# Patient Record
Sex: Female | Born: 1960
Health system: Southern US, Community
[De-identification: ages and names within clinical notes are randomized; demographics above are authoritative.]

## PROBLEM LIST (undated history)

## (undated) DIAGNOSIS — K279 Peptic ulcer, site unspecified, unspecified as acute or chronic, without hemorrhage or perforation: Secondary | ICD-10-CM

## (undated) DIAGNOSIS — C4492 Squamous cell carcinoma of skin, unspecified: Secondary | ICD-10-CM

## (undated) DIAGNOSIS — K900 Celiac disease: Secondary | ICD-10-CM

## (undated) DIAGNOSIS — D229 Melanocytic nevi, unspecified: Secondary | ICD-10-CM

## (undated) DIAGNOSIS — M8580 Other specified disorders of bone density and structure, unspecified site: Secondary | ICD-10-CM

## (undated) DIAGNOSIS — E785 Hyperlipidemia, unspecified: Secondary | ICD-10-CM

## (undated) DIAGNOSIS — C4491 Basal cell carcinoma of skin, unspecified: Secondary | ICD-10-CM

## (undated) DIAGNOSIS — Z86010 Personal history of colonic polyps: Secondary | ICD-10-CM

## (undated) DIAGNOSIS — Z800 Family history of malignant neoplasm of digestive organs: Secondary | ICD-10-CM

## (undated) HISTORY — DX: Personal history of colonic polyps: Z86.010

## (undated) HISTORY — PX: INNER EAR SURGERY: SHX679

## (undated) HISTORY — PX: TUBAL LIGATION: SHX77

## (undated) HISTORY — DX: Family history of malignant neoplasm of digestive organs: Z80.0

## (undated) HISTORY — DX: Celiac disease: K90.0

## (undated) HISTORY — DX: Melanocytic nevi, unspecified: D22.9

## (undated) HISTORY — DX: Basal cell carcinoma of skin, unspecified: C44.91

## (undated) HISTORY — DX: Squamous cell carcinoma of skin, unspecified: C44.92

## (undated) HISTORY — DX: Hyperlipidemia, unspecified: E78.5

## (undated) HISTORY — PX: ELBOW FRACTURE SURGERY: SHX616

## (undated) HISTORY — DX: Peptic ulcer, site unspecified, unspecified as acute or chronic, without hemorrhage or perforation: K27.9

## (undated) HISTORY — DX: Other specified disorders of bone density and structure, unspecified site: M85.80

---

## 2001-08-18 ENCOUNTER — Other Ambulatory Visit: Admission: RE | Admit: 2001-08-18 | Discharge: 2001-08-18 | Payer: Self-pay | Admitting: Family Medicine

## 2001-08-26 ENCOUNTER — Ambulatory Visit (HOSPITAL_COMMUNITY): Admission: RE | Admit: 2001-08-26 | Discharge: 2001-08-26 | Payer: Self-pay | Admitting: Family Medicine

## 2001-08-26 ENCOUNTER — Encounter: Payer: Self-pay | Admitting: Family Medicine

## 2001-11-18 ENCOUNTER — Ambulatory Visit (HOSPITAL_BASED_OUTPATIENT_CLINIC_OR_DEPARTMENT_OTHER): Admission: RE | Admit: 2001-11-18 | Discharge: 2001-11-18 | Payer: Self-pay | Admitting: Otolaryngology

## 2002-10-10 ENCOUNTER — Other Ambulatory Visit: Admission: RE | Admit: 2002-10-10 | Discharge: 2002-10-10 | Payer: Self-pay | Admitting: Family Medicine

## 2003-10-23 ENCOUNTER — Other Ambulatory Visit: Admission: RE | Admit: 2003-10-23 | Discharge: 2003-10-23 | Payer: Self-pay | Admitting: Family Medicine

## 2003-10-25 DIAGNOSIS — D229 Melanocytic nevi, unspecified: Secondary | ICD-10-CM

## 2003-10-25 HISTORY — DX: Melanocytic nevi, unspecified: D22.9

## 2003-11-13 ENCOUNTER — Ambulatory Visit (HOSPITAL_COMMUNITY): Admission: RE | Admit: 2003-11-13 | Discharge: 2003-11-13 | Payer: Self-pay | Admitting: Family Medicine

## 2004-02-06 ENCOUNTER — Encounter (INDEPENDENT_AMBULATORY_CARE_PROVIDER_SITE_OTHER): Payer: Self-pay | Admitting: Specialist

## 2004-02-06 ENCOUNTER — Encounter: Payer: Self-pay | Admitting: Gastroenterology

## 2004-02-06 ENCOUNTER — Ambulatory Visit (HOSPITAL_COMMUNITY): Admission: RE | Admit: 2004-02-06 | Discharge: 2004-02-06 | Payer: Self-pay | Admitting: Gastroenterology

## 2004-03-27 ENCOUNTER — Encounter: Admission: RE | Admit: 2004-03-27 | Discharge: 2004-03-27 | Payer: Self-pay | Admitting: Family Medicine

## 2004-04-29 ENCOUNTER — Ambulatory Visit: Payer: Self-pay | Admitting: Gastroenterology

## 2005-02-26 ENCOUNTER — Other Ambulatory Visit: Admission: RE | Admit: 2005-02-26 | Discharge: 2005-02-26 | Payer: Self-pay | Admitting: Family Medicine

## 2005-03-26 ENCOUNTER — Ambulatory Visit: Payer: Self-pay | Admitting: Gastroenterology

## 2005-04-09 ENCOUNTER — Ambulatory Visit: Payer: Self-pay | Admitting: Gastroenterology

## 2005-04-10 ENCOUNTER — Encounter: Payer: Self-pay | Admitting: Gastroenterology

## 2005-05-26 ENCOUNTER — Ambulatory Visit: Payer: Self-pay | Admitting: Gastroenterology

## 2006-05-20 ENCOUNTER — Other Ambulatory Visit: Admission: RE | Admit: 2006-05-20 | Discharge: 2006-05-20 | Payer: Self-pay | Admitting: Family Medicine

## 2006-06-21 ENCOUNTER — Encounter: Admission: RE | Admit: 2006-06-21 | Discharge: 2006-06-21 | Payer: Self-pay | Admitting: Family Medicine

## 2006-09-23 ENCOUNTER — Ambulatory Visit: Payer: Self-pay | Admitting: Gastroenterology

## 2006-09-23 LAB — CONVERTED CEMR LAB
ALT: 32 units/L (ref 0–40)
AST: 31 units/L (ref 0–37)
Albumin: 3.4 g/dL — ABNORMAL LOW (ref 3.5–5.2)
Alkaline Phosphatase: 104 units/L (ref 39–117)
BUN: 10 mg/dL (ref 6–23)
Basophils Absolute: 0.1 10*3/uL (ref 0.0–0.1)
Basophils Relative: 1.3 % — ABNORMAL HIGH (ref 0.0–1.0)
Bilirubin, Direct: 0.1 mg/dL (ref 0.0–0.3)
CO2: 30 meq/L (ref 19–32)
Calcium: 8.7 mg/dL (ref 8.4–10.5)
Chloride: 105 meq/L (ref 96–112)
Creatinine, Ser: 0.7 mg/dL (ref 0.4–1.2)
Eosinophils Absolute: 0.3 10*3/uL (ref 0.0–0.6)
Eosinophils Relative: 3.4 % (ref 0.0–5.0)
Ferritin: 4.8 ng/mL — ABNORMAL LOW (ref 10.0–291.0)
GFR calc Af Amer: 116 mL/min
GFR calc non Af Amer: 96 mL/min
Glucose, Bld: 123 mg/dL — ABNORMAL HIGH (ref 70–99)
HCT: 39.5 % (ref 36.0–46.0)
Hemoglobin: 13.5 g/dL (ref 12.0–15.0)
Iron: 40 ug/dL — ABNORMAL LOW (ref 42–145)
Lymphocytes Relative: 32.1 % (ref 12.0–46.0)
MCHC: 34.2 g/dL (ref 30.0–36.0)
MCV: 83.8 fL (ref 78.0–100.0)
Magnesium: 2 mg/dL (ref 1.5–2.5)
Monocytes Absolute: 0.6 10*3/uL (ref 0.2–0.7)
Monocytes Relative: 6.9 % (ref 3.0–11.0)
Neutro Abs: 4.4 10*3/uL (ref 1.4–7.7)
Neutrophils Relative %: 56.3 % (ref 43.0–77.0)
Platelets: 358 10*3/uL (ref 150–400)
Potassium: 3.7 meq/L (ref 3.5–5.1)
RBC: 4.71 M/uL (ref 3.87–5.11)
RDW: 13.3 % (ref 11.5–14.6)
Saturation Ratios: 9.4 % — ABNORMAL LOW (ref 20.0–50.0)
Sodium: 140 meq/L (ref 135–145)
TSH: 2.26 microintl units/mL (ref 0.35–5.50)
Tissue Transglutaminase Ab, IgA: 29 units — ABNORMAL HIGH (ref <5)
Total Bilirubin: 0.6 mg/dL (ref 0.3–1.2)
Total Protein: 6.9 g/dL (ref 6.0–8.3)
Transferrin: 304 mg/dL (ref >=212.0)
WBC: 8 10*3/uL (ref 4.5–10.5)

## 2007-09-14 ENCOUNTER — Encounter: Admission: RE | Admit: 2007-09-14 | Discharge: 2007-09-14 | Payer: Self-pay | Admitting: Family Medicine

## 2008-07-23 DIAGNOSIS — K900 Celiac disease: Secondary | ICD-10-CM | POA: Insufficient documentation

## 2008-07-23 DIAGNOSIS — Z860100 Personal history of colon polyps, unspecified: Secondary | ICD-10-CM | POA: Insufficient documentation

## 2008-07-23 DIAGNOSIS — K219 Gastro-esophageal reflux disease without esophagitis: Secondary | ICD-10-CM | POA: Insufficient documentation

## 2008-07-23 DIAGNOSIS — D509 Iron deficiency anemia, unspecified: Secondary | ICD-10-CM | POA: Insufficient documentation

## 2008-07-23 DIAGNOSIS — K644 Residual hemorrhoidal skin tags: Secondary | ICD-10-CM | POA: Insufficient documentation

## 2008-07-24 ENCOUNTER — Ambulatory Visit: Payer: Self-pay | Admitting: Gastroenterology

## 2008-07-24 DIAGNOSIS — M949 Disorder of cartilage, unspecified: Secondary | ICD-10-CM

## 2008-07-24 DIAGNOSIS — M899 Disorder of bone, unspecified: Secondary | ICD-10-CM | POA: Insufficient documentation

## 2008-07-24 LAB — CONVERTED CEMR LAB
Alkaline Phosphatase: 89 units/L (ref 39–117)
Basophils Absolute: 0.2 10*3/uL — ABNORMAL HIGH (ref 0.0–0.1)
Bilirubin, Direct: 0.1 mg/dL (ref 0.0–0.3)
CO2: 29 meq/L (ref 19–32)
Calcium: 9.4 mg/dL (ref 8.4–10.5)
Ferritin: 36 ng/mL (ref 10.0–291.0)
Folate: 18.4 ng/mL
GFR calc Af Amer: 115 mL/min
Glucose, Bld: 100 mg/dL — ABNORMAL HIGH (ref 70–99)
Hemoglobin: 16.8 g/dL — ABNORMAL HIGH (ref 12.0–15.0)
Lymphocytes Relative: 32.6 % (ref 12.0–46.0)
MCHC: 34.9 g/dL (ref 30.0–36.0)
Monocytes Relative: 14.9 % — ABNORMAL HIGH (ref 3.0–12.0)
Neutro Abs: 3.4 10*3/uL (ref 1.4–7.7)
Platelets: 255 10*3/uL (ref 150–400)
Potassium: 3.5 meq/L (ref 3.5–5.1)
RDW: 11.5 % (ref 11.5–14.6)
Saturation Ratios: 36.2 % (ref 20.0–50.0)
Sodium: 137 meq/L (ref 135–145)
Total Bilirubin: 1 mg/dL (ref 0.3–1.2)
Total Protein: 7.8 g/dL (ref 6.0–8.3)
Transferrin: 260.5 mg/dL (ref >=212.0)
Vitamin B-12: 501 pg/mL (ref 211–911)

## 2008-07-27 ENCOUNTER — Encounter: Payer: Self-pay | Admitting: Gastroenterology

## 2008-07-27 ENCOUNTER — Ambulatory Visit: Payer: Self-pay | Admitting: Gastroenterology

## 2008-07-27 ENCOUNTER — Telehealth (INDEPENDENT_AMBULATORY_CARE_PROVIDER_SITE_OTHER): Payer: Self-pay | Admitting: *Deleted

## 2008-07-27 DIAGNOSIS — Z860100 Personal history of colon polyps, unspecified: Secondary | ICD-10-CM

## 2008-07-27 HISTORY — DX: Personal history of colon polyps, unspecified: Z86.0100

## 2008-07-27 HISTORY — DX: Personal history of colonic polyps: Z86.010

## 2008-08-01 ENCOUNTER — Encounter: Payer: Self-pay | Admitting: Gastroenterology

## 2008-12-20 ENCOUNTER — Encounter: Payer: Self-pay | Admitting: Gastroenterology

## 2009-01-02 ENCOUNTER — Telehealth: Payer: Self-pay | Admitting: Gastroenterology

## 2009-08-06 ENCOUNTER — Encounter: Admission: RE | Admit: 2009-08-06 | Discharge: 2009-08-06 | Payer: Self-pay | Admitting: Family Medicine

## 2010-07-06 ENCOUNTER — Encounter: Payer: Self-pay | Admitting: Family Medicine

## 2010-08-19 ENCOUNTER — Other Ambulatory Visit: Payer: Self-pay | Admitting: Gastroenterology

## 2010-08-19 ENCOUNTER — Other Ambulatory Visit: Payer: BC Managed Care – PPO

## 2010-08-19 ENCOUNTER — Ambulatory Visit (INDEPENDENT_AMBULATORY_CARE_PROVIDER_SITE_OTHER): Payer: BC Managed Care – PPO | Admitting: Gastroenterology

## 2010-08-19 ENCOUNTER — Encounter: Payer: Self-pay | Admitting: Gastroenterology

## 2010-08-19 DIAGNOSIS — K900 Celiac disease: Secondary | ICD-10-CM

## 2010-08-19 LAB — IBC PANEL
Iron: 110 ug/dL (ref 42–145)
Transferrin: 235.1 mg/dL (ref 212.0–360.0)

## 2010-08-19 LAB — TSH: TSH: 1.67 u[IU]/mL (ref 0.35–5.50)

## 2010-08-19 LAB — BASIC METABOLIC PANEL
BUN: 14 mg/dL (ref 6–23)
Chloride: 106 mEq/L (ref 96–112)
GFR: 98.82 mL/min (ref >60.00)
Potassium: 4 mEq/L (ref 3.5–5.1)
Sodium: 142 mEq/L (ref 135–145)

## 2010-08-19 LAB — FOLATE: Folate: 10.2 ng/mL (ref >5.9)

## 2010-08-19 LAB — CBC WITH DIFFERENTIAL/PLATELET
Basophils Absolute: 0 10*3/uL (ref 0.0–0.1)
Eosinophils Absolute: 0.2 10*3/uL (ref 0.0–0.7)
Lymphocytes Relative: 34.4 % (ref 12.0–46.0)
MCHC: 34.6 g/dL (ref 30.0–36.0)
MCV: 93.7 fl (ref 78.0–100.0)
Monocytes Absolute: 0.6 10*3/uL (ref 0.1–1.0)
Neutrophils Relative %: 52.8 % (ref 43.0–77.0)
Platelets: 270 10*3/uL (ref 150.0–400.0)
RDW: 12.2 % (ref 11.5–14.6)

## 2010-08-19 LAB — HEPATIC FUNCTION PANEL
AST: 23 U/L (ref 0–37)
Alkaline Phosphatase: 81 U/L (ref 39–117)
Bilirubin, Direct: 0.1 mg/dL (ref 0.0–0.3)
Total Bilirubin: 0.5 mg/dL (ref 0.3–1.2)

## 2010-08-19 LAB — MAGNESIUM: Magnesium: 2.1 mg/dL (ref 1.5–2.5)

## 2010-08-26 NOTE — Assessment & Plan Note (Signed)
Summary: yearly check up..sch w pt bcbs-ins copay and cx fee advised ;...    History of Present Illness Visit Type: Follow-up Visit Primary GI MD: Maria Blalock MD FACP [place] Primary Provider: Josie [name] Chief Complaint: Patient here for gerd and sprue she denies any GI complaints. History of Present Illness:   This patient is completely asymptomatic in terms of any general medical or GI complaints. Her appetite is good and her weight is stable. She has no change in bowel habits, or symptomatology of malabsorption. She also denies any systemic or hepatobiliary complaints. She is not on any medications except Maxzide and ferrous sulfate.   GI Review of Systems      Denies abdominal pain, acid reflux, belching, bloating, chest pain, dysphagia with liquids, dysphagia with solids, heartburn, loss of appetite, nausea, vomiting, vomiting blood, weight loss, and  weight gain.        Denies anal fissure, black tarry stools, change in bowel habit, constipation, diarrhea, diverticulosis, fecal incontinence, heme positive stool, hemorrhoids, irritable bowel syndrome, jaundice, light color stool, liver problems, rectal bleeding, and  rectal pain.    Current Medications (verified): 1)  Maxzide 75-50 Mg Tabs (Triamterene-Hctz) .... Once Daily 2)  Ferrous Sulfate 325 (65 Fe) Mg Tabs (Ferrous Sulfate) .... Take Three By Mouth Once Daily  Allergies (verified): No Known Drug Allergies  Past History:  Social History: Last updated: 07/23/2008 Married one daughter and one son Occupation: Public affairs consultant Patient has never smoked.  Alcohol Use - no Illicit Drug Use - no  Past medical, surgical, family and social histories (including risk factors) reviewed for relevance to current acute and chronic problems.  Past Medical History: Reviewed history from 07/23/2008 and no changes required. Current Problems:  IRON DEFICIENCY (ICD-280.9) CELIAC DISEASE (ICD-579.0) COLONIC POLYPS, HX OF  (ICD-V12.72) EXTERNAL HEMORRHOIDS (ICD-455.3) GERD (ICD-530.81)  Past Surgical History: Reviewed history from 07/23/2008 and no changes required. Tubal Ligation 1984  Family History: Reviewed history from 07/23/2008 and no changes required. Family History of Colon Polyps:father Family History of Prostate Cancer:father Family History of Heart Disease: father, mother No FH of Colon Cancer:  Social History: Reviewed history from 07/23/2008 and no changes required. 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  Alcohol Use - no Illicit Drug Use - no  Review of Systems  The patient denies allergy/sinus, anemia, anxiety-new, arthritis/joint pain, back pain, blood in urine, breast changes/lumps, change in vision, confusion, cough, coughing up blood, depression-new, fainting, fatigue, fever, headaches-new, hearing problems, heart murmur, heart rhythm changes, itching, menstrual pain, muscle pains/cramps, night sweats, nosebleeds, pregnancy symptoms, shortness of breath, skin rash, sleeping problems, sore throat, swelling of feet/legs, swollen lymph glands, thirst - excessive , urination - excessive , urination changes/pain, urine leakage, vision changes, and voice change.    Vital Signs:  Patient profile:   50 year old female Height:      67 inches Weight:      168.6 pounds BMI:     26.50 Pulse rate:   76 / minute Pulse rhythm:   regular BP sitting:   100 / 60  (left arm) Cuff size:   regular  Vitals Entered By: Maria [name] CMA Maria [name]) (August 19, 2010 11:23 AM)  Physical Exam  General:  Well developed, well nourished, no acute distress.healthy appearing.   Head:  Normocephalic and atraumatic. Eyes:  PERRLA, no icterus.exam deferred to patient's ophthalmologist.   Lungs:  Clear throughout to auscultation. Heart:  Regular rate and rhythm; no  murmurs, rubs,  or bruits. Abdomen:  Soft, nontender and nondistended. No masses,  hepatosplenomegaly or hernias noted. Normal bowel sounds. Extremities:  No clubbing, cyanosis, edema or deformities noted. Neurologic:  Alert and  oriented x4;  grossly normal neurologically. Psych:  Alert and cooperative. Normal mood and affect.   Impression & Recommendations:  Problem # 1:  CELIAC DISEASE (ICD-579.0) Assessment Unchanged we will repeat screening labs for baseline purposes. She may be able to discontinue her iron replacement. I have not insisted that she follow a gluten free diet since she is completely asymptomatic. She is up-to-date R. endoscopy and colonoscopy exams. I'll follow her p.r.n. basis as needed. TLB-CBC Platelet - w/Differential (85025-CBCD) TLB-BMP (Basic Metabolic Panel-BMET) (71245-YKDXIPJ) TLB-Hepatic/Liver Function Pnl (80076-HEPATIC) TLB-TSH (Thyroid Stimulating Hormone) (84443-TSH) TLB-B12, Serum-Total ONLY (82505-L97) TLB-Ferritin (67341-PFX) TLB-Folic Acid (Folate) (90240-XBD) TLB-IBC Pnl (Iron/FE;Transferrin) (83550-IBC) TLB-Magnesium (Mg) (83735-MG)  Patient Instructions: 1)  Copy sent to : [place] 2)  Please go to the basement today for your labs.  3)  The medication list was reviewed and reconciled.  All changed / newly prescribed medications were explained.  A complete medication list was provided to the patient / caregiver.

## 2010-09-09 ENCOUNTER — Other Ambulatory Visit: Payer: Self-pay | Admitting: Family Medicine

## 2010-09-09 DIAGNOSIS — Z1231 Encounter for screening mammogram for malignant neoplasm of breast: Secondary | ICD-10-CM

## 2010-10-23 ENCOUNTER — Ambulatory Visit
Admission: RE | Admit: 2010-10-23 | Discharge: 2010-10-23 | Disposition: A | Discharge status: Home / Self Care | Payer: BC Managed Care – PPO | Source: Ambulatory Visit | Attending: Family Medicine | Admitting: Family Medicine

## 2010-10-23 DIAGNOSIS — Z1231 Encounter for screening mammogram for malignant neoplasm of breast: Secondary | ICD-10-CM

## 2010-10-31 NOTE — Op Note (Signed)
[facility]  Patient:    Maria [name], Maria [name] Visit Number: [id number] MRN: [id number]          Service Type: DSU Location: [facility] Attending Physician:  Clare Charon Dictated by:   Leonides Sake Lucia Gaskins, M.D. Proc. Date: 11/18/01 Admit Date:  11/18/2001                             Operative Report  PREOPERATIVE DIAGNOSIS:  Left ear conductive hearing loss approximately 25 to 30 decibals.  Otosclerosis.  POSTOPERATIVE DIAGNOSIS:  Left ear conductive hearing loss approximately 25 to 30 decibals.  Otosclerosis.  PROCEDURE:  Left ear exploratory tympanotomy with stapes mobilization.  SURGEON:  Leonides Sake. Lucia Gaskins, M.D.  ANESTHESIA:  General endotracheal anesthesia.  COMPLICATIONS:  None.  INDICATIONS:  [name] is a 50 year old female who has noted a gradual decline in hearing in her left ear.  She has no family history of hearing loss, does work in a noisy environment.  Audiologic testing was performed and demonstrated an approximately 25 to 30 decibal noted hearing loss in the lower frequencies.  She is taken to the operating room at this time for exploratory left tympanotomy with possible stapinotomy.  DESCRIPTION OF PROCEDURE:  After adequate endotracheal anesthesia, the left ear was prepped with Betadine solution and draped with sterile towels.  The patient received 1 gram Ancef IV preoperatively.  A posterior based tympanic membrane flap was then elevated down to the annulus.  Cotton pledgets soaked in epinephrine were used for hemostasis.  The annulus was elevated and no other space was entered.  I was able to obtain good visualization of the incus, stapes, suprastructure, and facial nerve with minimal curetting. Initial palpation and examination, the incus and stapes did seem to be with poor mobility, although, I could not visualize the oval window totally.  I separated the incus from the stapes with a joint knife and  upon completion of the separation, the stapes seemed to have better mobility.  The incus still seemed a little stiff in mobility, but the stapes seemed to have reasonable mobility along with a mobile footplate.  Because of the increased mobility of the footplate and stapes suprastructure, it was elected not to remove the stapes suprastructure and the incus was repositioned over the stapes.  It was a little difficult on the evaluation to determine whether the conductive hearing loss was secondary to poor incus mobility versus partially affixed stapes which were mobilized on separation.  I felt that the patient would be at less risk just performing stapes mobilization.  The tympanic membrane flap was brought back posteriorly.  The ear canal was filled with a couple pieces of Gelfoam soaked colimycin and bacitracin ointment.  Dressing was applied to the left ear.  The patient was awakened from anesthesia and transferred to the recovery room postoperatively doing well.  DISPOSITION:  [name] is discharged home later today on Tylenol and Vicodin p.r.n. pain.  Will have her follow up in my office in one week for recheck. She was instructed to keep water out of the ears. Dictated by:   Leonides Sake Lucia Gaskins, M.D. Attending Physician:  Clare Charon DD:  11/18/01 TD:  11/21/01 Job: [phone]

## 2010-10-31 NOTE — Assessment & Plan Note (Signed)
[facility]                         GASTROENTEROLOGY OFFICE NOTE   DOROTHIA, PASSMORE                        MRN:          [id number]  DATE:09/23/2006                            DOB:          1960-06-23    Maria [name] is a 50 year old white female who has been diagnosed as having  celiac disease confirmed by small bowel biopsy.  However, she has been  fairly asymptomatic except for some mild electrolyte imbalances.  She  really at this time does not follow a glut free diet and is having no  gastrointestinal or general medical problems whatsoever.  She comes in  for a yearly checkup.  She relates that she has had bone density scan  that was normal.  She is on daily multivitamin.   She weighs 181 pounds, which is up from 155 from November of 2005.  Blood pressure 116/72 and pulse was 72 and regular.  General physical  exam was unremarkable as was abdominal exam.   ASSESSMENT:  Roshelle has subclinical celiac disease and certainly has no  evidence of chronic malabsorption that is obvious.   RECOMMENDATIONS:  I have sent her by the lab to check repeat lab tests  including celiac antibody titers, carotin and various metabolic  parameters.  We will follow her on a yearly basis or p.r.n. as needed.     Loralee Pacas. Sharlett Iles, MD, [facility], [name]  Electronically Signed    DRP/MedQ  DD: 09/23/2006  DT: 09/23/2006  Job #: [id number]   cc:   [place]

## 2011-04-27 ENCOUNTER — Other Ambulatory Visit: Payer: Self-pay | Admitting: Otolaryngology

## 2011-08-04 ENCOUNTER — Ambulatory Visit (INDEPENDENT_AMBULATORY_CARE_PROVIDER_SITE_OTHER): Payer: BC Managed Care – PPO | Admitting: Physician Assistant

## 2011-08-04 ENCOUNTER — Telehealth: Payer: Self-pay | Admitting: Gastroenterology

## 2011-08-04 ENCOUNTER — Encounter: Payer: Self-pay | Admitting: Physician Assistant

## 2011-08-04 ENCOUNTER — Other Ambulatory Visit (INDEPENDENT_AMBULATORY_CARE_PROVIDER_SITE_OTHER): Payer: BC Managed Care – PPO

## 2011-08-04 DIAGNOSIS — R109 Unspecified abdominal pain: Secondary | ICD-10-CM

## 2011-08-04 DIAGNOSIS — R197 Diarrhea, unspecified: Secondary | ICD-10-CM

## 2011-08-04 LAB — CBC WITH DIFFERENTIAL/PLATELET
Basophils Relative: 0.4 % (ref 0.0–3.0)
Eosinophils Relative: 1.6 % (ref 0.0–5.0)
HCT: 45.4 % (ref 36.0–46.0)
Hemoglobin: 15.6 g/dL — ABNORMAL HIGH (ref 12.0–15.0)
Lymphs Abs: 2.1 10*3/uL (ref 0.7–4.0)
MCHC: 34.3 g/dL (ref 30.0–36.0)
MCV: 92.2 fl (ref 78.0–100.0)
Monocytes Absolute: 0.6 10*3/uL (ref 0.1–1.0)
Neutro Abs: 3.5 10*3/uL (ref 1.4–7.7)
Neutrophils Relative %: 54.9 % (ref 43.0–77.0)
RBC: 4.92 Mil/uL (ref 3.87–5.11)
WBC: 6.4 10*3/uL (ref 4.5–10.5)

## 2011-08-04 LAB — BASIC METABOLIC PANEL
BUN: 10 mg/dL (ref 6–23)
Creatinine, Ser: 0.6 mg/dL (ref 0.4–1.2)
GFR: 114.01 mL/min (ref >60.00)
Glucose, Bld: 99 mg/dL (ref 70–99)
Potassium: 3.2 mEq/L — ABNORMAL LOW (ref 3.5–5.1)

## 2011-08-04 MED ORDER — DICYCLOMINE HCL 10 MG PO CAPS: ORAL_CAPSULE | ORAL | Status: DC

## 2011-08-04 NOTE — Telephone Encounter (Signed)
Informed pt she needs an OV; she will see Nicoletta Ba, PA today.

## 2011-08-04 NOTE — Patient Instructions (Signed)
Please go to the basement level to have your labs drawn.   We sent prescription for [facility] in [place].  Eat a bland diet for now.

## 2011-08-04 NOTE — Progress Notes (Signed)
Subjective:    Patient ID: Maria [name], female    DOB: 1960-10-04, 51 y.o.   MRN: [id number]  HPI Maria [name] is a pleasant 51 year old female known to Dr. Sharlett [name]. She underwent colonoscopy in February of 2010-due to  family history of colon cancer. She was found to have one sessile polyp in the sigmoid colon consistent with a tubular adenoma and an otherwise normal exam. She is to followup in 5 years. She also underwent upper endoscopy in 2005 and was found to have villous atrophy in the duodenum and biopsies were consistent with celiac disease. Apparently she followed a gluten-free diet for a period of time but actually developed diarrhea while on a gluten-free diet which she had not been having previously been since has not followed any specific diet. She says she generally has felt well has had no problems with her bowels or abdominal pain.  She comes in today as an urgent add-on with complaints of 5-6 day history of crampy abdominal pain and diarrhea. She has not noted any melena or hematochezia. She has had some vague nausea but no vomiting positive chills but no documented fever. She does have a granddaughter who was  sick one day last week with a gastroenteritis type illness  but was only sick for 1 day. Patient says that she has not had any antibiotics since November at which point she had an ear infection.  She says she's not been eating much over the past several days. Her appetite has been decreased and she is having 8-10 watery bowel movements per day. She says her stool is malodorous.    Review of Systems  Constitutional: Positive for diaphoresis and appetite change.  HENT: Negative.   Eyes: Negative.   Respiratory: Negative.   Cardiovascular: Negative.   Gastrointestinal: Positive for abdominal pain and diarrhea.  Genitourinary: Negative.   Musculoskeletal: Negative.   Neurological: Negative.   Hematological: Negative.   Psychiatric/Behavioral: Negative.    Outpatient Encounter  Prescriptions as of 08/04/2011  Medication Sig Dispense Refill  . ezetimibe (ZETIA) 10 MG tablet Take 10 mg by mouth daily.      . sodium fluoride-calcium carbonate (FLORICAL) 8.3-364 MG CAPS Take 1 capsule by mouth daily.      . Triamterene-HCTZ (MAXZIDE-25 PO) Take 1 tablet by mouth daily as needed.      . vitamin E 400 UNIT capsule Take 400 Units by mouth daily.      Marland Kitchen dicyclomine (BENTYL) 10 MG capsule Take 1 tab 3-4 times daily as needed for cramping, diarrhea  120 capsule  0    No Known Allergies     Objective:   Physical Exam A well-developed white female in no acute distress, pleasant blood pressure 102/62 pulse 66 temperature 98.7. HEENT; nontraumatic normocephalic EOMI PERRLA sclera anicteric, Supple no JVD, Cardiovascular; regular rate and rhythm with S1-S2 no murmur gallop, Pulmonary; clear bilaterally, Abdomen; soft, tender in the left mid quadrant left lower quadrant no guarding no rebound no mass or palpable hepatosplenomegaly bowel sounds are active, Rectal; exam not done, Extremities; no clubbing cyanosis or edema, skin warm and dry, Psych; mood and affect normal and appropriate       Assessment & Plan:  #57 51 year old female with history of celiac disease which has been asymptomatic now presenting with an acute diarrheal illness x6 days abdominal cramping and malodorous watery stool. Current symptoms are consistent with an infectious colitis or gastroenteritis unclear viral versus bacterial.  Plan; Will obtain CBC , BMET and CRP today  Check stool cultures and stool for C. difficile PCR Add Bentyl 10 mg as needed every 6 hours for cramping Push fluids and continue bland diet Will hold on antibiotics unti lstudies are reviewed.  #2 positive family history of colon cancer in patient's father, last colonoscopy February of 2010 due for followup February 2015.

## 2011-08-04 NOTE — Telephone Encounter (Signed)
Last OV 08/19/10 for GERD, Celiac Sprue check up. Pt is current on COLON and EGD exams. She never followed a Gluten Free diet because IT caused her problems- documented in chart that she doesn't follow the diet by Dr Sharlett Iles. Pt reports this past Friday she woke up with diarrhea and has had it ever since- some days more stools than others. Imodium has not helped in stopping or slowing down the stools. She denies melena or a temp. She hasn't used an antibiotic recently. Reports her granddaughter had a GI upset with diarrhea earlier last week. This am she reports 8-9 stools already. Stools cultures or see Amy? Please advise.

## 2011-08-04 NOTE — Telephone Encounter (Signed)
Needs ov

## 2011-08-05 ENCOUNTER — Other Ambulatory Visit: Payer: Self-pay | Admitting: *Deleted

## 2011-08-05 MED ORDER — POTASSIUM CHLORIDE CRYS ER 20 MEQ PO TBCR: EXTENDED_RELEASE_TABLET | ORAL | Status: DC

## 2011-08-05 NOTE — Progress Notes (Signed)
Agree with Ms. Genia Harold assessment and plan. Also - if this workup fails to reveal a cause probably needs repeat celiac evaluation. Gatha Mayer, MD, [facility]

## 2011-08-06 ENCOUNTER — Other Ambulatory Visit: Payer: BC Managed Care – PPO

## 2011-08-06 DIAGNOSIS — R197 Diarrhea, unspecified: Secondary | ICD-10-CM

## 2011-08-07 LAB — CLOSTRIDIUM DIFFICILE BY PCR: Toxigenic C. Difficile by PCR: NOT DETECTED

## 2011-08-11 ENCOUNTER — Telehealth: Payer: Self-pay | Admitting: *Deleted

## 2011-08-11 NOTE — Telephone Encounter (Signed)
Called pt's home number and the person who answered stated she had gone to the dentist. Will call later.

## 2011-08-11 NOTE — Telephone Encounter (Signed)
Message copied by Lance Morin on Tue Aug 11, 2011 10:19 AM ------      Message from: Hulan Saas      Created: Mon Aug 10, 2011  4:21 PM                   ----- Message -----         From: Nicoletta Ba, PA         Sent: 08/10/2011   4:10 PM           To: Hulan Saas, RN            If she is still having diarrhea which is no better , she can wait it out til the end of the week then would start a gluten free diet

## 2011-08-12 NOTE — Telephone Encounter (Signed)
Spoke with pt late yesterday and she stated her diarrhea had cleared. Pt is advancing her diet and will begin a Gluten Free diet as she progresses; she will call for further problems.

## 2011-11-16 ENCOUNTER — Encounter: Payer: Self-pay | Admitting: *Deleted

## 2011-11-20 ENCOUNTER — Ambulatory Visit (INDEPENDENT_AMBULATORY_CARE_PROVIDER_SITE_OTHER): Payer: BC Managed Care – PPO | Admitting: Gastroenterology

## 2011-11-20 ENCOUNTER — Encounter: Payer: Self-pay | Admitting: Gastroenterology

## 2011-11-20 VITALS — BP 100/60 | HR 68 | Ht 67.0 in | Wt 172.0 lb

## 2011-11-20 DIAGNOSIS — Z800 Family history of malignant neoplasm of digestive organs: Secondary | ICD-10-CM

## 2011-11-20 DIAGNOSIS — K900 Celiac disease: Secondary | ICD-10-CM

## 2011-11-20 DIAGNOSIS — Z86010 Personal history of colonic polyps: Secondary | ICD-10-CM

## 2011-11-20 DIAGNOSIS — R195 Other fecal abnormalities: Secondary | ICD-10-CM

## 2011-11-20 DIAGNOSIS — K921 Melena: Secondary | ICD-10-CM

## 2011-11-20 MED ORDER — NA SULFATE-K SULFATE-MG SULF 17.5-3.13-1.6 GM/177ML PO SOLN: ORAL | Status: DC

## 2011-11-20 MED ORDER — MOVIPREP 100 G PO SOLR: 1.0000 | Freq: Once | ORAL | Status: DC

## 2011-11-20 NOTE — Progress Notes (Signed)
This is a very nice 51 year old Caucasian female who had viral gastroenteritis in February with negative stool exam and GI workup. Previously she has been diagnosed as having asymptomatic celiac disease, but she is on regular diet without symptomatology. Her father had colon cancer, and this patient had colonoscopy 3 years ago with removal of an adenomatous polyp. She currently denies diarrhea, abdominal pain, rectal bleeding, upper GI or hepatobiliary or systemic complaints. Recent Hemoccult at her primary care physician's office was positive. Review of her labs shows no significant anemia.  Current Medications, Allergies, Past Medical History, Past Surgical History, Family History and Social History were reviewed in [employer] record.  Pertinent Review of Systems Negative   Physical Exam: Healthy-appearing patient in no distress. Blood pressure 100/60, pulse 60 and regular, weight 172 pounds. I cannot appreciate stigmata of chronic liver disease. Chest is clear and cardiac exam is unremarkable. There is no hepatomegaly, nominal masses or tenderness. Bowel sounds are normal. Mental status is normal and peripheral extremities are unremarkable. Rectal exam was not repeated at this time.    Assessment and Plan: Guaiac positive stools and a 51 year old patient who is fairly asymptomatic, but she has a history of previous adenomatous polyps and a positive family history of colon cancer. I've scheduled her for colonoscopy at her convenience, and have asked continue her other medications as per primary care. Her celiac disease is not giving her any diarrhea or malabsorption problems. I have urged to continue calcium with vitamin D and multivitamins. Encounter Diagnosis  Name Primary?  . Blood in stool Yes

## 2011-11-20 NOTE — Patient Instructions (Addendum)
Your procedure has been scheduled for 12/07/2011, please follow the seperate instructions.  Suprep Sample given to the patient.

## 2011-12-07 ENCOUNTER — Telehealth: Payer: Self-pay | Admitting: Gastroenterology

## 2011-12-07 ENCOUNTER — Encounter: Payer: Self-pay | Admitting: Gastroenterology

## 2011-12-07 ENCOUNTER — Ambulatory Visit (AMBULATORY_SURGERY_CENTER): Payer: BC Managed Care – PPO | Admitting: Gastroenterology

## 2011-12-07 VITALS — BP 97/57 | HR 66 | Temp 96.5°F | Resp 17 | Ht 67.0 in | Wt 172.0 lb

## 2011-12-07 DIAGNOSIS — Z800 Family history of malignant neoplasm of digestive organs: Secondary | ICD-10-CM

## 2011-12-07 DIAGNOSIS — K921 Melena: Secondary | ICD-10-CM

## 2011-12-07 DIAGNOSIS — Z86010 Personal history of colonic polyps: Secondary | ICD-10-CM

## 2011-12-07 DIAGNOSIS — D509 Iron deficiency anemia, unspecified: Secondary | ICD-10-CM

## 2011-12-07 DIAGNOSIS — D126 Benign neoplasm of colon, unspecified: Secondary | ICD-10-CM

## 2011-12-07 MED ORDER — SODIUM CHLORIDE 0.9 % IV SOLN: 500.0000 mL | INTRAVENOUS | Status: DC

## 2011-12-07 NOTE — Telephone Encounter (Signed)
Patient states that she was having difficulty taking prep last evening and called on call physician who told her to take Miralax. She said she took Miralax last evening but was measuring it wrong and felt she took more than she should have but without response. She took 4 additional doses this morning and feels she still is not adequately responding. She states she is having liquid stools but still very dark and cloudy. Spoke with Dr. Sharlett Iles who requests that patient take a bottle of magnesium citrate now. Informed patient and she states understanding. We will see her at 2:00 pm.

## 2011-12-07 NOTE — Progress Notes (Signed)
15:16 [facility] CRNA hung 2nd bag of normal saline 0.9% 500 ml. Maw

## 2011-12-07 NOTE — Progress Notes (Signed)
The pt tolerated the colonoscopy very well. Maw   

## 2011-12-07 NOTE — Patient Instructions (Addendum)
YOU HAD AN ENDOSCOPIC PROCEDURE TODAY AT [facility]: Refer to the procedure report that was given to you for any specific questions about what was found during the examination.  If the procedure report does not answer your questions, please call your gastroenterologist to clarify.  If you requested that your care partner not be given the details of your procedure findings, then the procedure report has been included in a sealed envelope for you to review at your convenience later.  YOU SHOULD EXPECT: Some feelings of bloating in the abdomen. Passage of more gas than usual.  Walking can help get rid of the air that was put into your GI tract during the procedure and reduce the bloating. If you had a lower endoscopy (such as a colonoscopy or flexible sigmoidoscopy) you may notice spotting of blood in your stool or on the toilet paper. If you underwent a bowel prep for your procedure, then you may not have a normal bowel movement for a few days.  DIET: Your first meal following the procedure should be a light meal and then it is ok to progress to your normal diet.  A half-sandwich or bowl of soup is an example of a good first meal.  Heavy or fried foods are harder to digest and may make you feel nauseous or bloated.  Likewise meals heavy in dairy and vegetables can cause extra gas to form and this can also increase the bloating.  Drink plenty of fluids but you should avoid alcoholic beverages for 24 hours.  ACTIVITY: Your care partner should take you home directly after the procedure.  You should plan to take it easy, moving slowly for the rest of the day.  You can resume normal activity the day after the procedure however you should NOT DRIVE or use heavy machinery for 24 hours (because of the sedation medicines used during the test).    SYMPTOMS TO REPORT IMMEDIATELY: A gastroenterologist can be reached at any hour.  During normal business hours, 8:30 AM to 5:00 PM Monday through Friday,  call [phone].  After hours and on weekends, please call the GI answering service at [phone] who will take a message and have the physician on call contact you.   Following lower endoscopy (colonoscopy or flexible sigmoidoscopy):  Excessive amounts of blood in the stool  Significant tenderness or worsening of abdominal pains  Swelling of the abdomen that is new, acute  Fever of 100F or higher    FOLLOW UP: If any biopsies were taken you will be contacted by phone or by letter within the next 1-3 weeks.  Call your gastroenterologist if you have not heard about the biopsies in 3 weeks.  Our staff will call the home number listed on your records the next business day following your procedure to check on you and address any questions or concerns that you may have at that time regarding the information given to you following your procedure. This is a courtesy call and so if there is no answer at the home number and we have not heard from you through the emergency physician on call, we will assume that you have returned to your regular daily activities without incident.  SIGNATURES/CONFIDENTIALITY: You and/or your care partner have signed paperwork which will be entered into your electronic medical record.  These signatures attest to the fact that that the information above on your After Visit Summary has been reviewed and is understood.  Full responsibility of the confidentiality  of this discharge information lies with you and/or your care-partner.    Information on polyps given to you today

## 2011-12-07 NOTE — Op Note (Signed)
[facility] [address] [facility] [address] [place], [place]  [address]  COLONOSCOPY PROCEDURE REPORT  PATIENT:  Maria [name], Maria [name]  MR#:  [id number] BIRTHDATE:  [birth year], 50 yrs. old  GENDER:  female ENDOSCOPIST:  Loralee Pacas. Sharlett Iles, MD, [facility] REF. BY: PROCEDURE DATE:  12/07/2011 PROCEDURE:  Colonoscopy with snare polypectomy ASA CLASS:  Class I INDICATIONS:  FOBT positive stool, family history of colon cancer  MEDICATIONS:   propofol (Diprivan) 350 mg IV  DESCRIPTION OF PROCEDURE:   After the risks and benefits and of the procedure were explained, informed consent was obtained. Digital rectal exam was performed and revealed no abnormalities. The LB PCF-H180AL [id number] endoscope was introduced through the anus and advanced to the cecum, which was identified by both the appendix and ileocecal valve.  The quality of the prep was excellent, using MoviPrep.  The instrument was then slowly withdrawn as the colon was fully examined. <<PROCEDUREIMAGES>>  FINDINGS:  A sessile polyp was found in the cecum. FLAT MUCOID CECAL POLYP HOT SNARE REMOVED.JAR #2.  A sessile polyp was found in the proximal transverse colon. 4 MM FLAT POLYP IN TV COLON COLD SNARE REMOVED.JAR #1.  A diminutive polyp was found in the rectum. SMALL 2MM POLYP IN RECTUM DESTROYED WITH CAUTERY.  This was otherwise a normal examination of the colon.   Retroflexed views in the rectum revealed no abnormalities.    The scope was then withdrawn from the patient and the procedure completed. COMPLICATIONS:  None ENDOSCOPIC IMPRESSION: 1) Sessile polyp in the cecum 2) Sessile polyp in the proximal transverse colon 3) Diminutive polyp in the rectum 4) Otherwise normal examination PROBABLE SERRATED ADENOMAS. RECOMMENDATIONS: 1) Await biopsy results 2) Repeat Colonoscopy in 5 years.  REPEAT EXAM:  No  ______________________________ Loralee Pacas. Sharlett Iles, MD, [facility]  CC:  Chevis Pretty, NP  n. Lorrin MaisMarland Kitchen   Loralee Pacas. Jolon Degante at  12/07/2011 03:28 PM  Araceli Bouche, [phone]

## 2011-12-07 NOTE — Progress Notes (Signed)
Patient did not experience any of the following events: a burn prior to discharge; a fall within the facility; wrong site/side/patient/procedure/implant event; or a hospital transfer or hospital admission upon discharge from the facility. (G8907) Patient did not have preoperative order for IV antibiotic SSI prophylaxis. (G8918)  

## 2011-12-08 ENCOUNTER — Telehealth: Payer: Self-pay

## 2011-12-08 NOTE — Telephone Encounter (Signed)
  Follow up Call-  Call back number 12/07/2011  Post procedure Call Back phone  # [phone]  Permission to leave phone message Yes     Patient questions:  Do you have a fever, pain , or abdominal swelling? no Pain Score  0 *  Have you tolerated food without any problems? yes  Have you been able to return to your normal activities? yes  Do you have any questions about your discharge instructions: Diet   no Medications  no Follow up visit  no  Do you have questions or concerns about your Care? no  Actions: * If pain score is 4 or above: No action needed, pain <4.  Per the pt, "I had a very small amount of BRB on the toilet tissue last night, but have not seen any this am".  I advised the pt if the bleeding increases or does not stop to please call us back.  The pt said she would get back in touch with [place] if needed. Maw

## 2011-12-15 ENCOUNTER — Encounter: Payer: Self-pay | Admitting: Gastroenterology

## 2012-08-23 ENCOUNTER — Other Ambulatory Visit: Payer: Self-pay

## 2012-08-23 DIAGNOSIS — Z1231 Encounter for screening mammogram for malignant neoplasm of breast: Secondary | ICD-10-CM

## 2012-10-04 ENCOUNTER — Ambulatory Visit
Admission: RE | Admit: 2012-10-04 | Discharge: 2012-10-04 | Disposition: A | Discharge status: Home / Self Care | Payer: BC Managed Care – PPO | Source: Ambulatory Visit

## 2012-10-04 DIAGNOSIS — Z1231 Encounter for screening mammogram for malignant neoplasm of breast: Secondary | ICD-10-CM

## 2012-11-15 ENCOUNTER — Ambulatory Visit (INDEPENDENT_AMBULATORY_CARE_PROVIDER_SITE_OTHER): Payer: BC Managed Care – PPO | Admitting: Nurse Practitioner

## 2012-11-15 ENCOUNTER — Encounter: Payer: Self-pay | Admitting: Nurse Practitioner

## 2012-11-15 VITALS — BP 93/61 | HR 67 | Temp 98.0°F | Ht 67.0 in | Wt 169.0 lb

## 2012-11-15 DIAGNOSIS — Z124 Encounter for screening for malignant neoplasm of cervix: Secondary | ICD-10-CM

## 2012-11-15 DIAGNOSIS — R609 Edema, unspecified: Secondary | ICD-10-CM

## 2012-11-15 DIAGNOSIS — Z0000 Encounter for general adult medical examination without abnormal findings: Secondary | ICD-10-CM

## 2012-11-15 DIAGNOSIS — Z6828 Body mass index (BMI) 28.0-28.9, adult: Secondary | ICD-10-CM

## 2012-11-15 DIAGNOSIS — Z01419 Encounter for gynecological examination (general) (routine) without abnormal findings: Secondary | ICD-10-CM

## 2012-11-15 LAB — POCT URINALYSIS DIPSTICK
Nitrite, UA: NEGATIVE
Protein, UA: NEGATIVE
Spec Grav, UA: 1.015
Urobilinogen, UA: NEGATIVE

## 2012-11-15 LAB — COMPLETE METABOLIC PANEL WITH GFR
AST: 21 U/L (ref 0–37)
Albumin: 3.7 g/dL (ref 3.5–5.2)
BUN: 11 mg/dL (ref 6–23)
CO2: 28 mEq/L (ref 19–32)
Calcium: 8.9 mg/dL (ref 8.4–10.5)
Chloride: 105 mEq/L (ref 96–112)
GFR, Est African American: >89 mL/min
GFR, Est Non African American: >89 mL/min
Glucose, Bld: 92 mg/dL (ref 70–99)
Potassium: 3.9 mEq/L (ref 3.5–5.3)

## 2012-11-15 LAB — POCT CBC
Granulocyte percent: 60.9 %G (ref 37–80)
HCT, POC: 43.1 % (ref 37.7–47.9)
Hemoglobin: 14.8 g/dL (ref 12.2–16.2)
MCV: 94.1 fL (ref 80–97)
RBC: 4.6 M/uL (ref 4.04–5.48)

## 2012-11-15 LAB — POCT UA - MICROSCOPIC ONLY
Casts, Ur, LPF, POC: NEGATIVE
Crystals, Ur, HPF, POC: NEGATIVE
Yeast, UA: NEGATIVE

## 2012-11-15 LAB — THYROID PANEL WITH TSH: Free Thyroxine Index: 2.9 (ref 1.0–3.9)

## 2012-11-15 MED ORDER — TRIAMTERENE-HCTZ 37.5-25 MG PO TABS: 1.0000 | ORAL_TABLET | Freq: Every day | ORAL | Status: DC

## 2012-11-15 MED ORDER — PHENTERMINE HCL 37.5 MG PO TABS: 37.5000 mg | ORAL_TABLET | Freq: Every day | ORAL | Status: DC

## 2012-11-15 NOTE — Patient Instructions (Signed)

## 2012-11-15 NOTE — Progress Notes (Signed)
Subjective:    Patient ID: Maria [name], female    DOB: March 05, 1961, 52 y.o.   MRN: [id number]  HPI  Pateint here today for GYN appointment- Patient doing well no complaints today- Patient had shoulder injected at last appointment and it is 75% better- Will wait 2 more months then reinject.    Review of Systems  All other systems reviewed and are negative.       Objective:   Physical Exam  Constitutional: She is oriented to person, place, and time. She appears well-developed and well-nourished.  HENT:  Head: Normocephalic.  Right Ear: Hearing, tympanic membrane, external ear and ear canal normal.  Left Ear: Hearing, tympanic membrane, external ear and ear canal normal.  Nose: Nose normal.  Mouth/Throat: Uvula is midline and oropharynx is clear and moist.  Eyes: Conjunctivae and EOM are normal. Pupils are equal, round, and reactive to light.  Neck: Normal range of motion and full passive range of motion without pain. Neck supple. No JVD present. Carotid bruit is not present. No mass and no thyromegaly present.  Cardiovascular: Normal rate, normal heart sounds and intact distal pulses.   No murmur heard. Pulmonary/Chest: Effort normal and breath sounds normal.  Abdominal: Soft. Bowel sounds are normal. She exhibits no mass. There is no tenderness.  Genitourinary: Vagina normal and uterus normal. No breast swelling, tenderness, discharge or bleeding.  bimanual exam-No adnexal masses or tenderness. cervix  Musculoskeletal: Normal range of motion.  Lymphadenopathy:    She has no cervical adenopathy.  Neurological: She is alert and oriented to person, place, and time.  Skin: Skin is warm and dry.  Psychiatric: She has a normal mood and affect. Her behavior is normal. Judgment and thought content normal.     BP 93/61  Pulse 67  Temp(Src) 98 F (36.7 C) (Oral)  Ht 5' 7"  (1.702 m)  Wt 169 lb (76.658 kg)  BMI 26.46 kg/m2      Assessment & Plan:   1. Annual physical exam    2. Peripheral edema   3. BMI 28.0-28.9,adult   4. Encounter for routine gynecological examination    Orders Placed This Encounter  Procedures  . COMPLETE METABOLIC PANEL WITH GFR  . NMR Lipoprofile with Lipids  . Thyroid Panel With TSH  . POCT UA - Microscopic Only  . POCT urinalysis dipstick  . POCT CBC     Medication List       These changes are accurate as of: 11/15/2012 12:07 PM. If you have any questions, ask your nurse or doctor.          TAKE these medications       ALLERGY PO  Take 1 capsule by mouth as directed.     ezetimibe 10 MG tablet  Commonly known as:  ZETIA  Take 10 mg by mouth daily.     phentermine 37.5 MG tablet  Commonly known as:  ADIPEX-P  Take 1 tablet (37.5 mg total) by mouth daily before breakfast.  Started by:  Chevis Pretty, FNP     sodium fluoride-calcium carbonate 8.3-364 MG Caps  Commonly known as:  FLORICAL  Take 1 capsule by mouth daily.     triamterene-hydrochlorothiazide 37.5-25 MG per tablet  Commonly known as:  MAXZIDE-25  Take 1 tablet by mouth daily.  Changed by:  Chevis Pretty, FNP     vitamin E 400 UNIT capsule  Take 400 Units by mouth daily.       Continue all meds Labs pending Diet and  exercise encouraged Follow-up in 6 months  Mary-Margaret Hassell Done, FNP

## 2012-11-16 LAB — PAP IG W/ RFLX HPV ASCU

## 2012-11-16 LAB — NMR LIPOPROFILE WITH LIPIDS
Cholesterol, Total: 152 mg/dL (ref <200)
LDL Particle Number: 1796 nmol/L — ABNORMAL HIGH (ref <1000)
Large VLDL-P: 2.3 nmol/L (ref <=2.7)
Small LDL Particle Number: 1276 nmol/L — ABNORMAL HIGH (ref <=527)
VLDL Size: 46.8 nm — ABNORMAL HIGH (ref <=46.6)

## 2012-11-17 ENCOUNTER — Telehealth: Payer: Self-pay | Admitting: Nurse Practitioner

## 2012-11-18 NOTE — Telephone Encounter (Signed)
Spoke with patient and given lab result

## 2012-12-01 ENCOUNTER — Telehealth: Payer: Self-pay | Admitting: Nurse Practitioner

## 2012-12-01 NOTE — Telephone Encounter (Signed)
Call and see what dose sh eneeds- sent in dose that is on chart

## 2012-12-23 ENCOUNTER — Telehealth: Payer: Self-pay | Admitting: Nurse Practitioner

## 2012-12-23 MED ORDER — PITAVASTATIN CALCIUM 4 MG PO TABS: 4.0000 mg | ORAL_TABLET | Freq: Every day | ORAL | Status: DC

## 2012-12-23 MED ORDER — TRIAMTERENE-HCTZ 75-50 MG PO TABS: 1.0000 | ORAL_TABLET | Freq: Every day | ORAL | Status: DC

## 2012-12-23 NOTE — Telephone Encounter (Signed)
Please advise 

## 2012-12-30 ENCOUNTER — Telehealth: Payer: Self-pay | Admitting: Nurse Practitioner

## 2012-12-30 NOTE — Telephone Encounter (Signed)
Samples up front. Pt notified

## 2013-02-17 ENCOUNTER — Telehealth: Payer: Self-pay | Admitting: Nurse Practitioner

## 2013-02-17 NOTE — Telephone Encounter (Signed)
Cut dose in half and see how she does

## 2013-02-17 NOTE — Telephone Encounter (Signed)
Please advise 

## 2013-02-20 NOTE — Telephone Encounter (Signed)
She will try taking half a pill.  If right elbow begins to hurt again, she will stop.

## 2013-04-06 ENCOUNTER — Ambulatory Visit (INDEPENDENT_AMBULATORY_CARE_PROVIDER_SITE_OTHER): Payer: BC Managed Care – PPO | Admitting: Family Medicine

## 2013-04-06 ENCOUNTER — Encounter: Payer: Self-pay | Admitting: Family Medicine

## 2013-04-06 VITALS — BP 119/78 | HR 94 | Temp 99.0°F | Ht 67.0 in | Wt 165.0 lb

## 2013-04-06 DIAGNOSIS — J069 Acute upper respiratory infection, unspecified: Secondary | ICD-10-CM

## 2013-04-06 MED ORDER — GUAIFENESIN-CODEINE 100-10 MG/5ML PO SOLN: 5.0000 mL | Freq: Three times a day (TID) | ORAL | Status: DC | PRN

## 2013-04-06 MED ORDER — METHYLPREDNISOLONE ACETATE 80 MG/ML IJ SUSP
80.0000 mg | Freq: Once | INTRAMUSCULAR | Status: AC
  Administered 2013-04-06: 80 mg via INTRAMUSCULAR

## 2013-04-07 ENCOUNTER — Telehealth: Payer: Self-pay | Admitting: Family Medicine

## 2013-04-11 ENCOUNTER — Other Ambulatory Visit: Payer: Self-pay | Admitting: Family Medicine

## 2013-04-11 MED ORDER — CYCLOBENZAPRINE HCL 10 MG PO TABS: 10.0000 mg | ORAL_TABLET | Freq: Three times a day (TID) | ORAL | Status: DC | PRN

## 2013-04-11 NOTE — Telephone Encounter (Signed)
[place] please address

## 2013-04-11 NOTE — Progress Notes (Signed)
  Subjective:    Patient ID: Maria [name], female    DOB: November 25, 1960, 52 y.o.   MRN: [id number]  HPI URI Symptoms Onset: 1-2 days  Description: rhinorrhea, nasal congestion, cough, wheezing  Modifying factors:  none  Symptoms Nasal discharge: yes Fever: no Sore throat: no Cough: yes Wheezing: yes Ear pain: no GI symptoms: no Sick contacts: yes  Red Flags  Stiff neck: no Dyspnea: no Rash: no Swallowing difficulty: no  Sinusitis Risk Factors Headache/face pain: no Double sickening: no tooth pain: no  Allergy Risk Factors Sneezing: no Itchy scratchy throat: no Seasonal symptoms: no  Flu Risk Factors Headache: no muscle aches: no severe fatigue: no     Review of Systems  All other systems reviewed and are negative.       Objective:   Physical Exam  Constitutional: She is oriented to person, place, and time. She appears well-developed and well-nourished.  HENT:  Head: Normocephalic and atraumatic.  Right Ear: External ear normal.  Left Ear: External ear normal.  +nasal erythema, rhinorrhea bilaterally, + post oropharyngeal erythema    Eyes: Conjunctivae are normal. Pupils are equal, round, and reactive to light.  Neck: Normal range of motion. Neck supple.  Cardiovascular: Normal rate and regular rhythm.   Pulmonary/Chest: Effort normal.  Faint expiratory wheezes in apices    Abdominal: Soft. Bowel sounds are normal.  Musculoskeletal: Normal range of motion.  Lymphadenopathy:    She has no cervical adenopathy.  Neurological: She is alert and oriented to person, place, and time.  Skin: Skin is warm.          Assessment & Plan:  URI (upper respiratory infection) - Plan: guaiFENesin-codeine 100-10 MG/5ML syrup, methylPREDNISolone acetate (DEPO-MEDROL) injection 80 mg  Likely viral source of sxs.  Depomedrol 30m IM x 1 for wheezing  Robitussin AC for cough  Discussed supportive care and infectious/resp red flags.  Follow up as needed.       The patient and/or caregiver has been counseled thoroughly with regard to treatment plan and/or medications prescribed including dosage, schedule, interactions, rationale for use, and possible side effects and they verbalize understanding. Diagnoses and expected course of recovery discussed and will return if not improved as expected or if the condition worsens. Patient and/or caregiver verbalized understanding.

## 2013-04-11 NOTE — Telephone Encounter (Signed)
Called in flexeril for pt.  Please inform Come back in for reeval if pain still persists despite treatment.

## 2013-04-13 NOTE — Telephone Encounter (Signed)
Pt aware  Pt still has a lot of cough and congestion- is there anything we can send in for this?

## 2013-09-02 ENCOUNTER — Ambulatory Visit (INDEPENDENT_AMBULATORY_CARE_PROVIDER_SITE_OTHER): Payer: BC Managed Care – PPO | Admitting: Family Medicine

## 2013-09-02 ENCOUNTER — Encounter: Payer: Self-pay | Admitting: Family Medicine

## 2013-09-02 VITALS — BP 115/74 | HR 105 | Temp 101.9°F | Ht 67.0 in | Wt 172.2 lb

## 2013-09-02 DIAGNOSIS — J111 Influenza due to unidentified influenza virus with other respiratory manifestations: Secondary | ICD-10-CM | POA: Insufficient documentation

## 2013-09-02 DIAGNOSIS — R509 Fever, unspecified: Secondary | ICD-10-CM | POA: Insufficient documentation

## 2013-09-02 LAB — POCT INFLUENZA A/B
Influenza A, POC: NEGATIVE
Influenza B, POC: POSITIVE

## 2013-09-02 MED ORDER — OSELTAMIVIR PHOSPHATE 75 MG PO CAPS: 75.0000 mg | ORAL_CAPSULE | Freq: Two times a day (BID) | ORAL | Status: DC

## 2013-09-02 NOTE — Progress Notes (Signed)
Patient ID: Maria [name], female   DOB: 1960-10-10, 53 y.o.   MRN: [id number] SUBJECTIVE: CC: Chief Complaint  Patient presents with  . Generalized Body Aches  . Fever  . Cough  . Chills    HPI: Sore Throat Patient complains of sore throat. Associated symptoms include chills, coryza, dry cough, headache, myalgias, nasal blockage, post nasal drip, sinus and nasal congestion and sore throat. Onset of symptoms was 1 day ago, and have been gradually worsening since that time. She is drinking plenty of fluids. She has not had recent close exposure to someone with proven streptococcal pharyngitis.  Past Medical History  Diagnosis Date  . Personal history of colonic polyps 07/27/2008    TUBULAR ADENOMA  . Hyperlipidemia   . Celiac disease   . Family history of malignant neoplasm of gastrointestinal tract    Past Surgical History  Procedure Laterality Date  . Tubal ligation    . Inner ear surgery     History   Social History  . Marital Status: Married    Spouse Name: N/A    Number of Children: 2  . Years of Education: N/A   Occupational History  . Supervisor    Social History Main Topics  . Smoking status: Never Smoker   . Smokeless tobacco: Never Used  . Alcohol Use: No  . Drug Use: No  . Sexual Activity: Not on file   Other Topics Concern  . Not on file   Social History Narrative  . No narrative on file   Family History  Problem Relation Age of Onset  . Colon cancer Father 40  . Esophageal cancer Neg Hx   . Rectal cancer Neg Hx   . Stomach cancer Neg Hx    Current Outpatient Prescriptions on File Prior to Visit  Medication Sig Dispense Refill  . triamterene-hydrochlorothiazide (MAXZIDE) 75-50 MG per tablet Take 1 tablet by mouth daily.  30 tablet  5  . Pitavastatin Calcium 4 MG TABS Take 2 mg by mouth daily.      . sodium fluoride-calcium carbonate (FLORICAL) 8.3-364 MG CAPS Take 1 capsule by mouth daily.      . vitamin E 400 UNIT capsule Take 400 Units by mouth  daily.       No current facility-administered medications on file prior to visit.   Allergies  Allergen Reactions  . Claritin [Loratadine]    Immunization History  Administered Date(s) Administered  . Influenza,inj,Quad PF,36+ Mos 04/18/2013   Prior to Admission medications   Medication Sig Start Date End Date Taking? Authorizing Provider  triamterene-hydrochlorothiazide (MAXZIDE) 75-50 MG per tablet Take 1 tablet by mouth daily. 12/23/12  Yes Mary-Margaret Daphine Deutscher, FNP  oseltamivir (TAMIFLU) 75 MG capsule Take 1 capsule (75 mg total) by mouth 2 (two) times daily. 09/02/13   Ileana Ladd, MD  Pitavastatin Calcium 4 MG TABS Take 2 mg by mouth daily. 12/23/12   Mary-Margaret Daphine Deutscher, FNP  sodium fluoride-calcium carbonate (FLORICAL) 8.3-364 MG CAPS Take 1 capsule by mouth daily.    Historical Provider, MD  vitamin E 400 UNIT capsule Take 400 Units by mouth daily.    Historical Provider, MD     ROS: As above in the HPI. All other systems are stable or negative.  OBJECTIVE: APPEARANCE:  Patient in no acute distress.The patient appeared well nourished and normally developed. Acyanotic. Waist: VITAL SIGNS:BP 115/74  Pulse 105  Temp(Src) 101.9 F (38.8 C) (Oral)  Ht 5\' 7"  (1.702 m)  Wt 172 lb 3.2 oz (  78.109 kg)  BMI 26.96 kg/m2 WF appears ill  SKIN: warm and  Dry without overt rashes, tattoos and scars  HEAD and Neck: without JVD, Head and scalp: normal Eyes:No scleral icterus. Fundi normal, eye movements normal. Ears: Auricle normal, canal normal, Tympanic membranes normal, insufflation normal. Nose: rhinorrhea Throat: hyperemic Neck & thyroid: normal  CHEST & LUNGS: Chest wall: normal Lungs: Clear, hacky cough  CVS: Reveals the PMI to be normally located. Regular rhythm, First and Second Heart sounds are normal,  absence of murmurs, rubs or gallops. Peripheral vasculature: Radial pulses: normal Dorsal pedis pulses: normal Posterior pulses: normal  ABDOMEN:   Appearance: normal Benign, no organomegaly, no masses, no Abdominal Aortic enlargement. No Guarding , no rebound. No Bruits. Bowel sounds: normal  RECTAL: N/A GU: N/A  EXTREMETIES: nonedematous.  MUSCULOSKELETAL:  Spine: normal Joints: intact Myalgias/muscle aches.  NEUROLOGIC: oriented to time,place and person; nonfocal.  ASSESSMENT: Fever, unspecified - Plan: POCT Influenza A/B  Influenza with other respiratory manifestations  PLAN:  Orders Placed This Encounter  Procedures  . POCT Influenza A/B   Results for orders placed in visit on 09/02/13  POCT INFLUENZA A/B      Result Value Ref Range   Influenza A, POC Negative     Influenza B, POC Positive      Meds ordered this encounter  Medications  . oseltamivir (TAMIFLU) 75 MG capsule    Sig: Take 1 capsule (75 mg total) by mouth 2 (two) times daily.    Dispense:  10 capsule    Refill:  0   Medications Discontinued During This Encounter  Medication Reason  . cyclobenzaprine (FLEXERIL) 10 MG tablet Completed Course  . guaiFENesin-codeine 100-10 MG/5ML syrup Completed Course   Return if symptoms worsen or fail to improve.  Adriyana Greenbaum P. [place]esto Charon, M.D.

## 2013-09-02 NOTE — Patient Instructions (Signed)
Influenza A (H1N1) H1N1 formerly called "swine flu" is a new influenza virus causing sickness in people. The H1N1 virus is different from seasonal influenza viruses. However, the H1N1 symptoms are similar to seasonal influenza and it is spread from person to person. You may be at higher risk for serious problems if you have underlying serious medical conditions. The CDC and the [facility] are following reported cases around the world. CAUSES   The flu is thought to spread mainly person-to-person through coughing or sneezing of infected people.  A person may become infected by touching something with the virus on it and then touching their mouth or nose. SYMPTOMS   Fever.  Headache.  Tiredness.  Cough.  Sore throat.  Runny or stuffy nose.  Body aches.  Diarrhea and vomiting These symptoms are referred to as "flu-like symptoms." A lot of different illnesses, including the common cold, may have similar symptoms. DIAGNOSIS   There are tests that can tell if you have the H1N1 virus.  Confirmed cases of H1N1 will be reported to the state or local health department.  A doctor's exam may be needed to tell whether you have an infection that is a complication of the flu. HOME CARE INSTRUCTIONS   Stay informed. Visit the [facility] website for current recommendations. Visit [employer]. You may also call 1-800-CDC-INFO 5083899019).  Get help early if you develop any of the above symptoms.  If you are at high risk from complications of the flu, talk to your caregiver as soon as you develop flu-like symptoms. Those at higher risk for complications include:  People 65 years or older.  People with chronic medical conditions.  Pregnant women.  Young children.  Your caregiver may recommend antiviral medicine to help treat the flu.  If you get the flu, get plenty of rest, drink enough water and fluids to keep your urine clear or pale yellow, and avoid using  alcohol or tobacco.  You may take over-the-counter medicine to relieve the symptoms of the flu if your caregiver approves. (Never give aspirin to children or teenagers who have flu-like symptoms, particularly fever). TREATMENT  If you do get sick, antiviral drugs are available. These drugs can make your illness milder and make you feel better faster. Treatment should start soon after illness starts. It is only effective if taken within the first day of becoming ill. Only your caregiver can prescribe antiviral medication.  PREVENTION   Cover your nose and mouth with a tissue or your arm when you cough or sneeze. Throw the tissue away.  Wash your hands often with soap and warm water, especially after you cough or sneeze. Alcohol-based cleaners are also effective against germs.  Avoid touching your eyes, nose or mouth. This is one way germs spread.  Try to avoid contact with sick people. Follow public health advice regarding school closures. Avoid crowds.  Stay home if you get sick. Limit contact with others to keep from infecting them. People infected with the H1N1 virus may be able to infect others anywhere from 1 day before feeling sick to 5-7 days after getting flu symptoms.  An H1N1 vaccine is available to help protect against the virus. In addition to the H1N1 vaccine, you will need to be vaccinated for seasonal influenza. The H1N1 and seasonal vaccines may be given on the same day. The CDC especially recommends the H1N1 vaccine for:  Pregnant women.  People who live with or care for children younger than 65 months of age.  Health care and emergency services personnel.  Persons between the ages of 56 months through 78 years of age.  People from ages 69 through 79 years who are at higher risk for H1N1 because of chronic health disorders or immune system problems. FACEMASKS In community and home settings, the use of facemasks and N95 respirators are not normally recommended. In certain  circumstances, a facemask or N95 respirator may be used for persons at increased risk of severe illness from influenza. Your caregiver can give additional recommendations for facemask use. IN CHILDREN, EMERGENCY WARNING SIGNS THAT NEED URGENT MEDICAL CARE:  Fast breathing or trouble breathing.  Bluish skin color.  Not drinking enough fluids.  Not waking up or not interacting normally.  Being so fussy that the child does not want to be held.  Your child has an oral temperature above 102 F (38.9 C), not controlled by medicine.  Your baby is older than 3 months with a rectal temperature of 102 F (38.9 C) or higher.  Your baby is 69 months old or younger with a rectal temperature of 100.4 F (38 C) or higher.  Flu-like symptoms improve but then return with fever and worse cough. IN ADULTS, EMERGENCY WARNING SIGNS THAT NEED URGENT MEDICAL CARE:  Difficulty breathing or shortness of breath.  Pain or pressure in the chest or abdomen.  Sudden dizziness.  Confusion.  Severe or persistent vomiting.  Bluish color.  You have a oral temperature above 102 F (38.9 C), not controlled by medicine.  Flu-like symptoms improve but return with fever and worse cough. SEEK IMMEDIATE MEDICAL CARE IF:  You or someone you know is experiencing any of the above symptoms. When you arrive at the emergency center, report that you think you have the flu. You may be asked to wear a mask and/or sit in a secluded area to protect others from getting sick. MAKE SURE YOU:   Understand these instructions.  Will watch your condition.  Will get help right away if you are not doing well or get worse. Some of this information courtesy of the CDC.  Document Released: 11/18/2007 Document Revised: 08/24/2011 Document Reviewed: 11/18/2007 [facility] Patient Information 2014 [place], [place].

## 2013-11-10 ENCOUNTER — Telehealth: Payer: Self-pay | Admitting: Nurse Practitioner

## 2013-11-10 MED ORDER — TRIAMTERENE-HCTZ 75-50 MG PO TABS: 1.0000 | ORAL_TABLET | Freq: Every day | ORAL | Status: DC

## 2013-11-10 NOTE — Telephone Encounter (Signed)
done

## 2013-11-13 ENCOUNTER — Encounter: Payer: Self-pay | Admitting: *Deleted

## 2013-11-28 ENCOUNTER — Encounter: Payer: Self-pay | Admitting: Nurse Practitioner

## 2013-11-28 ENCOUNTER — Ambulatory Visit (INDEPENDENT_AMBULATORY_CARE_PROVIDER_SITE_OTHER): Payer: BC Managed Care – PPO | Admitting: Nurse Practitioner

## 2013-11-28 VITALS — BP 104/74 | HR 82 | Temp 98.0°F | Ht 67.0 in | Wt 169.2 lb

## 2013-11-28 DIAGNOSIS — I10 Essential (primary) hypertension: Secondary | ICD-10-CM

## 2013-11-28 DIAGNOSIS — Z124 Encounter for screening for malignant neoplasm of cervix: Secondary | ICD-10-CM

## 2013-11-28 DIAGNOSIS — Z01419 Encounter for gynecological examination (general) (routine) without abnormal findings: Secondary | ICD-10-CM

## 2013-11-28 DIAGNOSIS — Z0000 Encounter for general adult medical examination without abnormal findings: Secondary | ICD-10-CM

## 2013-11-28 LAB — POCT CBC
GRANULOCYTE PERCENT: 68.2 % (ref 37–80)
HEMATOCRIT: 43.9 % (ref 37.7–47.9)
Hemoglobin: 14.2 g/dL (ref 12.2–16.2)
LYMPH, POC: 2 (ref 0.6–3.4)
MCH, POC: 29.9 pg (ref 27–31.2)
MCHC: 32.3 g/dL (ref 31.8–35.4)
MCV: 92.6 fL (ref 80–97)
MPV: 8.4 fL (ref 0–99.8)
PLATELET COUNT, POC: 286 10*3/uL (ref 142–424)
POC GRANULOCYTE: 4.9 (ref 2–6.9)
POC LYMPH %: 27.1 % (ref 10–50)
RBC: 4.7 M/uL (ref 4.04–5.48)
RDW, POC: 12.6 %
WBC: 7.2 10*3/uL (ref 4.6–10.2)

## 2013-11-28 LAB — POCT UA - MICROSCOPIC ONLY
BACTERIA, U MICROSCOPIC: NEGATIVE
CRYSTALS, UR, HPF, POC: NEGATIVE
Casts, Ur, LPF, POC: NEGATIVE
Mucus, UA: NEGATIVE
Yeast, UA: NEGATIVE

## 2013-11-28 LAB — POCT URINALYSIS DIPSTICK
BILIRUBIN UA: NEGATIVE
Glucose, UA: NEGATIVE
KETONES UA: NEGATIVE
Nitrite, UA: NEGATIVE
PROTEIN UA: NEGATIVE
SPEC GRAV UA: 1.015
Urobilinogen, UA: NEGATIVE
pH, UA: 6

## 2013-11-28 MED ORDER — TRIAMTERENE-HCTZ 75-50 MG PO TABS: 1.0000 | ORAL_TABLET | Freq: Every day | ORAL | Status: DC

## 2013-11-28 MED ORDER — PHENTERMINE HCL 37.5 MG PO TABS: 37.5000 mg | ORAL_TABLET | Freq: Every day | ORAL | Status: DC

## 2013-11-28 NOTE — Patient Instructions (Signed)

## 2013-11-28 NOTE — Progress Notes (Signed)
Subjective:    Patient ID: Maria [name], female    DOB: [birth year], 53 y.o.   MRN: [id number]  HPI Patient here today For CPE and PAP- She is doing well today without complaints:  Hypertension Maxzide keeps B/P under control- no c/o side effects   Review of Systems  Constitutional: Negative.   HENT: Negative.   Respiratory: Negative.   Cardiovascular: Negative.   Genitourinary: Negative.   Neurological: Negative.   Psychiatric/Behavioral: Negative.   All other systems reviewed and are negative.      Objective:   Physical Exam  Constitutional: She is oriented to person, place, and time. She appears well-developed and well-nourished.  HENT:  Head: Normocephalic.  Right Ear: Hearing, tympanic membrane, external ear and ear canal normal.  Left Ear: Hearing, tympanic membrane, external ear and ear canal normal.  Nose: Nose normal.  Mouth/Throat: Uvula is midline and oropharynx is clear and moist.  Eyes: Conjunctivae and EOM are normal. Pupils are equal, round, and reactive to light.  Neck: Normal range of motion and full passive range of motion without pain. Neck supple. No JVD present. Carotid bruit is not present. No mass and no thyromegaly present.  Cardiovascular: Normal rate, normal heart sounds and intact distal pulses.   No murmur heard. Pulmonary/Chest: Effort normal and breath sounds normal. Right breast exhibits no inverted nipple, no mass, no nipple discharge, no skin change and no tenderness. Left breast exhibits no inverted nipple, no mass, no nipple discharge, no skin change and no tenderness.  Abdominal: Soft. Bowel sounds are normal. She exhibits no mass. There is no tenderness.  Genitourinary: Vagina normal and uterus normal. Guaiac negative stool. No breast swelling, tenderness, discharge or bleeding.  bimanual exam-No adnexal masses or tenderness. Cervix parous and pink- no discharge  Musculoskeletal: Normal range of motion.  Lymphadenopathy:    She has no  cervical adenopathy.  Neurological: She is alert and oriented to person, place, and time.  Skin: Skin is warm and dry.  Psychiatric: She has a normal mood and affect. Her behavior is normal. Judgment and thought content normal.   BP 104/74  Pulse 82  Temp(Src) 98 F (36.7 C) (Oral)  Ht 5' 7"  (1.702 m)  Wt 169 lb 3.2 oz (76.749 kg)  BMI 26.49 kg/m2        Assessment & Plan:   1. Encounter for routine gynecological examination   2. Hypertension   3. Annual physical exam    Orders Placed This Encounter  Procedures  . CMP14+EGFR  . NMR, lipoprofile  . Thyroid Panel With TSH  . POCT UA - Microscopic Only  . POCT urinalysis dipstick  . POCT CBC   Meds ordered this encounter  Medications  . triamterene-hydrochlorothiazide (MAXZIDE) 75-50 MG per tablet    Sig: Take 1 tablet by mouth daily.    Dispense:  30 tablet    Refill:  5    Order Specific Question:  Supervising Oneida Mckamey    Answer:  Chipper Herb [[id number]]  . phentermine (ADIPEX-P) 37.5 MG tablet    Sig: Take 1 tablet (37.5 mg total) by mouth daily before breakfast.    Dispense:  30 tablet    Refill:  1    Order Specific Question:  Supervising Micai Apolinar    Answer:  Joycelyn Man    Patient to schedule mammo Labs pending Health maintenance reviewed Diet and exercise encouraged Continue all meds Follow up  In 6 months    [place], FNP

## 2013-11-29 ENCOUNTER — Telehealth: Payer: Self-pay | Admitting: Nurse Practitioner

## 2013-11-29 LAB — NMR, LIPOPROFILE
Cholesterol: 163 mg/dL (ref 100–199)
HDL Cholesterol by NMR: 50 mg/dL (ref >39)
HDL Particle Number: 35.6 umol/L (ref >=30.5)
LDL PARTICLE NUMBER: 1346 nmol/L — AB (ref <1000)
LDL SIZE: 20.4 nm (ref >20.5)
LDLC SERPL CALC-MCNC: 93 mg/dL (ref 0–99)
LP-IR SCORE: 56 — AB (ref <=45)
Small LDL Particle Number: 723 nmol/L — ABNORMAL HIGH (ref <=527)
TRIGLYCERIDES BY NMR: 98 mg/dL (ref 0–149)

## 2013-11-29 LAB — CMP14+EGFR
ALBUMIN: 4 g/dL (ref 3.5–5.5)
ALT: 19 IU/L (ref 0–32)
AST: 16 IU/L (ref 0–40)
Albumin/Globulin Ratio: 1.5 (ref 1.1–2.5)
Alkaline Phosphatase: 78 IU/L (ref 39–117)
BILIRUBIN TOTAL: 0.4 mg/dL (ref 0.0–1.2)
BUN/Creatinine Ratio: 22 (ref 9–23)
BUN: 15 mg/dL (ref 6–24)
CHLORIDE: 99 mmol/L (ref 97–108)
CO2: 26 mmol/L (ref 18–29)
Calcium: 9.1 mg/dL (ref 8.7–10.2)
Creatinine, Ser: 0.67 mg/dL (ref 0.57–1.00)
GFR calc non Af Amer: 101 mL/min/{1.73_m2} (ref >59)
GFR, EST AFRICAN AMERICAN: 116 mL/min/{1.73_m2} (ref >59)
GLUCOSE: 108 mg/dL — AB (ref 65–99)
Globulin, Total: 2.7 g/dL (ref 1.5–4.5)
POTASSIUM: 3.7 mmol/L (ref 3.5–5.2)
Sodium: 140 mmol/L (ref 134–144)
TOTAL PROTEIN: 6.7 g/dL (ref 6.0–8.5)

## 2013-11-29 LAB — THYROID PANEL WITH TSH
FREE THYROXINE INDEX: 1.8 (ref 1.2–4.9)
T3 UPTAKE RATIO: 25 % (ref 24–39)
T4 TOTAL: 7 ug/dL (ref 4.5–12.0)
TSH: 2.33 u[IU]/mL (ref 0.450–4.500)

## 2013-11-29 NOTE — Telephone Encounter (Signed)
Message copied by Cline Crock on Wed Nov 29, 2013  2:59 PM ------      Message from: Chevis Pretty      Created: Wed Nov 29, 2013  1:39 PM       Cbc normal      Kidney and liver function stable      Cholesterol improving      Thyroid panel normal      leuks in urine- if become symptomatic let me know and I will call in rx      PAP results pending      Low fat diet and exercise ------

## 2013-11-30 NOTE — Telephone Encounter (Signed)
Pt aware of lab results. Aware pap is pending  She forgot to ask MMM / nurse about issues with livalo--after 3wks taking this med it makes arm / elbow hurt.  If MMM wants her to continue taking this, she  needs presc. Refill for livalo sent to [facility] in [place].  Call Her about [phone]

## 2013-11-30 NOTE — Telephone Encounter (Signed)
Can wait and see what labs look like at next visit- low fat diet and exercise

## 2013-12-01 ENCOUNTER — Telehealth: Payer: Self-pay | Admitting: Nurse Practitioner

## 2013-12-01 LAB — PAP IG W/ RFLX HPV ASCU: PAP SMEAR COMMENT: 0

## 2013-12-01 NOTE — Telephone Encounter (Signed)
Patient aware.

## 2013-12-01 NOTE — Telephone Encounter (Signed)
Message copied by Cline Crock on Fri Dec 01, 2013  2:18 PM ------      Message from: Chevis Pretty      Created: Fri Dec 01, 2013  1:42 PM       Pap normal- repeat in 2 years ------

## 2013-12-11 ENCOUNTER — Other Ambulatory Visit: Payer: BC Managed Care – PPO | Admitting: Family Medicine

## 2013-12-27 ENCOUNTER — Telehealth: Payer: Self-pay | Admitting: Nurse Practitioner

## 2013-12-27 NOTE — Telephone Encounter (Signed)
Ok to fax note that hse had a physical

## 2013-12-28 NOTE — Telephone Encounter (Signed)
Patient aware this has been faxed

## 2014-02-01 ENCOUNTER — Encounter: Payer: Self-pay | Admitting: Nurse Practitioner

## 2014-02-01 ENCOUNTER — Ambulatory Visit (INDEPENDENT_AMBULATORY_CARE_PROVIDER_SITE_OTHER): Payer: BC Managed Care – PPO | Admitting: Nurse Practitioner

## 2014-02-01 VITALS — BP 101/63 | HR 80 | Temp 98.5°F | Ht 67.0 in | Wt 175.0 lb

## 2014-02-01 DIAGNOSIS — N816 Rectocele: Secondary | ICD-10-CM

## 2014-02-01 NOTE — Patient Instructions (Signed)
Rectocele/Enterocele, Care After A woman's birth canal (vagina) can become weak or stretched. This can be caused by childbirth, heavy lifting, lasting (chronic) constipation, aging, or pelvic surgery. When the vagina is weak and stretched, parts of the intestine can bulge into the vagina by pushing against the vaginal walls. A rectocele is when the very end of the large intestine (rectum) causes the bulge. An enterocele is when the small intestine causes the bulge. Surgery to fix this problem is usually done through the vagina. If you just had this surgery, you were probably given a drug to make you sleep (general anesthetic) or a drug that numbs you from the waist down (spinal/epidural). Here is what happened:  The small intestine or rectum was pushed back to its normal place.  The vaginal wall was made stronger. Sometimes this is done with stitches or a mesh-like material. [place]  Some women go home the same day as their surgery. Others stay in the hospital for a few days. This depends on the size and type of repair.  Pain and Medications  Some pain is normal after this surgery. Only take pain medicine your surgeon prescribed. Follow the directions carefully.  Do not take aspirin. It can cause bleeding.  Do not drink alcohol while taking pain medication.  You may be given a medicine (antibiotic) that kills germs. Follow the directions carefully.  Take warm sitz baths 2 times a day to control discomfort and reduce any swelling. Take sitz baths with your caregiver's permission. Diet  Go back to your normal eating as directed by your caregiver.  Drink a lot of fluids. Drink at least 6 glasses of water every day. Activity  Move around and walk as much as possible. This can keep blood clots from forming in your legs.  Do not climb stairs until your caregiver says it is okay.  Do not lift objects 5 pounds (2.3 kg) or heavier. Do not bend or strain for 6 to 8 weeks.  Do  not drive until after you stop taking pain medicine and your caregiver says it is okay.  Your return to work will depend on the type of work you do. Ask your caregiver what is best for you.  Ask your caregiver when you can resume sexual activity. Most women can start having sex in about 6 weeks after their surgery.  Get plenty of rest during the day and sleep at night.  Have someone help you with your household chores and activities for 3 to 4 weeks. Other Precautions  You may have some discharge from the vagina for a few weeks after the surgery. It may have small amounts of blood in it. This is normal. If you have questions, ask your caregiver.  Do not use tampons or douche.  You should be able to take a shower a day after your surgery. Do not take a tub bath for at least a week.  Take it easy for awhile. You should feel much better in 2 to 3 weeks. It may take up to 6 weeks to feel completely normal.  Keep all follow-up appointments.  Take your temperature twice a day and write it down.  Make sure your family understands everything about your surgery and recovery. SEEK MEDICAL CARE IF:   You have any questions about your medication, or you need stronger pain medication.  Pain continues, even after taking pain medication.  You become constipated.  You have an oral temperature above 102 F (38.9 C).  You develop swelling and redness in the surgery area.  You become dizzy or lightheaded.  You feel sick to your stomach (nauseous), throw up (vomit), or have diarrhea.  You develop a rash.  You have a reaction to your medications. SEEK IMMEDIATE MEDICAL CARE IF:   Pain gets worse.  You have new bleeding from your vagina.  Discharge from the vagina becomes heavy, or it has a bad smell.  You have an oral temperature above 102 F (38.9 C), not controlled by medicine.  You develop belly (abdominal) pain.  You develop chest pain.  You develop shortness of  breath.  You pass out (faint).  You develop pain, swelling, or redness in the leg.  You have pain or burning with urination.  You have bloody urine or cannot urinate. MAKE SURE YOU:   Understand these instructions.  Will watch your condition.  Will get help right away if you are not doing well or get worse. Document Released: 08/26/2009 Document Revised: 03/22/2013 Document Reviewed: 08/26/2009 [facility] Patient Information 2015 [place], [place]. This information is not intended to replace advice given to you by your health care provider. Make sure you discuss any questions you have with your health care provider.

## 2014-02-01 NOTE — Progress Notes (Signed)
   Subjective:    Patient ID: Maria [name], female    DOB: 04/25/1961, 53 y.o.   MRN: [id number]  HPI  Had a pump come up on her perineal area- She said that she was washing her bottom off and felt something funny- She looked with  Mirror and she said something looks funny down there. She denies any pain- no vaginal diacharge-  No pain with intercourse.    Review of Systems  All other systems reviewed and are negative.      Objective:   Physical Exam  Constitutional: She is oriented to person, place, and time. She appears well-developed and well-nourished.  Cardiovascular: Normal rate, regular rhythm and normal heart sounds.   Pulmonary/Chest: Effort normal and breath sounds normal.  Genitourinary:  Slight rectocele with soft vaginal tag- slight cystocele Cervix parous and pink with no discharge NO palpable masses  Neurological: She is alert and oriented to person, place, and time.  Skin: Skin is warm and dry.  Psychiatric: She has a normal mood and affect. Her behavior is normal. Judgment and thought content normal.    BP 101/63  Pulse 80  Temp(Src) 98.5 F (36.9 C) (Oral)  Ht 5' 7"  (1.702 m)  Wt 175 lb (79.379 kg)  BMI 27.40 kg/m2       Assessment & Plan:   1. Rectocele    Orders Placed This Encounter  Procedures  . Ambulatory referral to Obstetrics / Gynecology    Referral Priority:  Routine    Referral Type:  Consultation    Referral Reason:  Specialty Services Required    Referred to Provider:  Marvene Staff, MD    Requested Specialty:  Obstetrics and Gynecology    Number of Visits Requested:  1   Follow up prn  Mary-Margaret Hassell Done, FNP

## 2014-02-13 ENCOUNTER — Telehealth: Payer: Self-pay | Admitting: Nurse Practitioner

## 2014-02-13 NOTE — Telephone Encounter (Signed)
NTBS.

## 2014-02-13 NOTE — Telephone Encounter (Signed)
Mouth and tongue very sensitive to carbonated drinks, spicy foods. Mouth was burning really bad last week when eating just spaghetti. She doesn't know what to do? She read something that could be related to menopause. What does she need to do? No new meds started.

## 2014-02-13 NOTE — Telephone Encounter (Signed)
Pt also want to know if she needs B12 level, she is very fatiqued.

## 2014-02-14 NOTE — Telephone Encounter (Signed)
Patient aware anointment made

## 2014-02-15 ENCOUNTER — Ambulatory Visit (INDEPENDENT_AMBULATORY_CARE_PROVIDER_SITE_OTHER): Payer: BC Managed Care – PPO | Admitting: Nurse Practitioner

## 2014-02-15 ENCOUNTER — Telehealth: Payer: Self-pay | Admitting: Nurse Practitioner

## 2014-02-15 ENCOUNTER — Encounter: Payer: Self-pay | Admitting: Nurse Practitioner

## 2014-02-15 VITALS — BP 118/73 | HR 84 | Temp 98.8°F | Ht 67.0 in | Wt 171.8 lb

## 2014-02-15 DIAGNOSIS — K1370 Unspecified lesions of oral mucosa: Secondary | ICD-10-CM

## 2014-02-15 DIAGNOSIS — R208 Other disturbances of skin sensation: Secondary | ICD-10-CM

## 2014-02-15 MED ORDER — MAGIC MOUTHWASH: 5.0000 mL | Freq: Four times a day (QID) | ORAL | Status: DC

## 2014-02-15 NOTE — Progress Notes (Signed)
   Subjective:    Patient ID: Maria [name], female    DOB: 1960/10/06, 53 y.o.   MRN: [id number]  HPI Patient has a burning sensation in mouth for 4 months that is getting worse and not able to eat several foods because her tongue feels like it is on fire. Has noticed the mouth is very dry and having to drink more sips of liquids throughtout the day.  Has been very tired for the past month and sleeping well but is taking naps more often.    Review of Systems  Constitutional: Positive for fatigue.  HENT: Negative for mouth sores.        Mouth burning that is mainly tongue sensation  Skin: Negative.   Psychiatric/Behavioral: Negative.        Objective:   Physical Exam  Constitutional: She is oriented to person, place, and time. She appears well-developed and well-nourished.  HENT:  White patchy area on right inside of cheek.    Neck: Normal range of motion.  Cardiovascular: Normal rate, regular rhythm and normal heart sounds.   Pulmonary/Chest: Effort normal.  Neurological: She is alert and oriented to person, place, and time.  Skin: Skin is warm and dry.  Psychiatric: She has a normal mood and affect. Her behavior is normal. Judgment and thought content normal.          Assessment & Plan:   1. Burning sensation of mouth    Orders Placed This Encounter  Procedures  . Vitamin B12     Meds ordered this encounter  Medications  . Alum & Mag Hydroxide-Simeth (MAGIC MOUTHWASH) SOLN    Sig: Take 5 mLs by mouth 4 (four) times daily.    Dispense:  200 mL    Refill:  2    Order Specific Question:  Supervising Provider    Answer:  Chipper Herb [[id number]]   If no improvement let me know  Mary-Margaret Hassell Done, FNP

## 2014-02-16 LAB — VITAMIN B12: VITAMIN B 12: 348 pg/mL (ref 211–946)

## 2014-02-16 NOTE — Telephone Encounter (Signed)
[facility] received rx

## 2014-07-19 ENCOUNTER — Ambulatory Visit (INDEPENDENT_AMBULATORY_CARE_PROVIDER_SITE_OTHER): Payer: BLUE CROSS/BLUE SHIELD | Admitting: Nurse Practitioner

## 2014-07-19 ENCOUNTER — Encounter: Payer: Self-pay | Admitting: Nurse Practitioner

## 2014-07-19 ENCOUNTER — Other Ambulatory Visit: Payer: Self-pay

## 2014-07-19 VITALS — BP 106/64 | HR 79 | Temp 97.7°F | Ht 67.0 in | Wt 170.0 lb

## 2014-07-19 DIAGNOSIS — E785 Hyperlipidemia, unspecified: Secondary | ICD-10-CM

## 2014-07-19 DIAGNOSIS — Z1231 Encounter for screening mammogram for malignant neoplasm of breast: Secondary | ICD-10-CM

## 2014-07-19 DIAGNOSIS — B370 Candidal stomatitis: Secondary | ICD-10-CM

## 2014-07-19 DIAGNOSIS — I10 Essential (primary) hypertension: Secondary | ICD-10-CM

## 2014-07-19 DIAGNOSIS — D509 Iron deficiency anemia, unspecified: Secondary | ICD-10-CM

## 2014-07-19 MED ORDER — TRIAMTERENE-HCTZ 75-50 MG PO TABS: 1.0000 | ORAL_TABLET | Freq: Every day | ORAL | Status: DC

## 2014-07-19 NOTE — Progress Notes (Addendum)
   Subjective:    Patient ID: Ritisha Deitrick, female    DOB: 1961/03/10, 54 y.o.   MRN: [id number]   Patient here today for follow up of chronic medical problems. SHe is c/o burning sensation of tongue- B12 orally seems to help. Has used magic mouthwash and that helps some.  Hypertension This is a chronic problem. The current episode started more than 1 year ago. The problem is unchanged. The problem is controlled. There are no associated agents to hypertension. Risk factors for coronary artery disease include post-menopausal state. Past treatments include diuretics. The current treatment provides moderate improvement. There are no compliance problems.   Hyperlipidemia This is a chronic problem. The current episode started more than 1 year ago. The problem is controlled. Recent lipid tests were reviewed and are variable. Factors aggravating her hyperlipidemia include thiazides. She is currently on no antihyperlipidemic treatment. The current treatment provides mild improvement of lipids. There are no compliance problems.  Risk factors for coronary artery disease include dyslipidemia, hypertension and post-menopausal.  anemia Iron deficiency- multi vitamin daily   Review of Systems  Constitutional: Negative.   HENT: Negative.   Respiratory: Negative.   Cardiovascular: Negative.   Genitourinary: Negative.   Neurological: Negative.   Hematological: Negative.   Psychiatric/Behavioral: Negative.   All other systems reviewed and are negative.      Objective:   Physical Exam  Constitutional: She is oriented to person, place, and time. She appears well-developed and well-nourished.  HENT:  Nose: Nose normal.  Mouth/Throat: Oropharynx is clear and moist.  Light white film on tongue  Eyes: EOM are normal.  Neck: Trachea normal, normal range of motion and full passive range of motion without pain. Neck supple. No JVD present. Carotid bruit is not present. No thyromegaly present.  Cardiovascular:  Normal rate, regular rhythm, normal heart sounds and intact distal pulses.  Exam reveals no gallop and no friction rub.   No murmur heard. Pulmonary/Chest: Effort normal and breath sounds normal.  Abdominal: Soft. Bowel sounds are normal. She exhibits no distension and no mass. There is no tenderness.  Musculoskeletal: Normal range of motion.  Lymphadenopathy:    She has no cervical adenopathy.  Neurological: She is alert and oriented to person, place, and time. She has normal reflexes.  Skin: Skin is warm and dry.  Psychiatric: She has a normal mood and affect. Her behavior is normal. Judgment and thought content normal.   BP 106/64 mmHg  Pulse 79  Temp(Src) 97.7 F (36.5 C) (Oral)  Ht 5' 7"  (1.702 m)  Wt 170 lb (77.111 kg)  BMI 26.62 kg/m2        Assessment & Plan:  1. Oral candidiasis Patient will use magic mouth wash prn Will waiton B12 results  2. Essential hypertension Do not add salt to diet - CMP14+EGFR - triamterene-hydrochlorothiazide (MAXZIDE) 75-50 MG per tablet; Take 1 tablet by mouth daily.  Dispense: 30 tablet; Refill: 5  3. Hyperlipidemia with target LDL less than 100 Low fat diet - NMR, lipoprofile  4. Anemia, iron deficiency - Anemia Profile B   Patient to schedule mammogram Labs pending Health maintenance reviewed Diet and exercise encouraged Continue all meds Follow up  In 3 month   [place], FNP

## 2014-07-19 NOTE — Patient Instructions (Signed)

## 2014-07-20 LAB — CMP14+EGFR
A/G RATIO: 1.5 (ref 1.1–2.5)
ALT: 21 IU/L (ref 0–32)
AST: 19 IU/L (ref 0–40)
Albumin: 4.3 g/dL (ref 3.5–5.5)
Alkaline Phosphatase: 101 IU/L (ref 39–117)
BUN/Creatinine Ratio: 19 (ref 9–23)
BUN: 14 mg/dL (ref 6–24)
CALCIUM: 9.4 mg/dL (ref 8.7–10.2)
CO2: 24 mmol/L (ref 18–29)
CREATININE: 0.73 mg/dL (ref 0.57–1.00)
Chloride: 96 mmol/L — ABNORMAL LOW (ref 97–108)
GFR calc Af Amer: 108 mL/min/{1.73_m2} (ref >59)
GFR, EST NON AFRICAN AMERICAN: 94 mL/min/{1.73_m2} (ref >59)
GLUCOSE: 105 mg/dL — AB (ref 65–99)
Globulin, Total: 2.9 g/dL (ref 1.5–4.5)
POTASSIUM: 3.9 mmol/L (ref 3.5–5.2)
Sodium: 139 mmol/L (ref 134–144)
TOTAL PROTEIN: 7.2 g/dL (ref 6.0–8.5)
Total Bilirubin: 0.5 mg/dL (ref 0.0–1.2)

## 2014-07-20 LAB — ANEMIA PROFILE B
BASOS: 1 %
Basophils Absolute: 0.1 10*3/uL (ref 0.0–0.2)
EOS: 2 %
Eosinophils Absolute: 0.2 10*3/uL (ref 0.0–0.4)
FOLATE: 5 ng/mL (ref >3.0)
Ferritin: 43 ng/mL (ref 15–150)
HEMATOCRIT: 45.8 % (ref 34.0–46.6)
HEMOGLOBIN: 15.8 g/dL (ref 11.1–15.9)
Immature Grans (Abs): 0 10*3/uL (ref 0.0–0.1)
Immature Granulocytes: 0 %
Iron Saturation: 32 % (ref 15–55)
Iron: 112 ug/dL (ref 27–159)
LYMPHS: 32 %
Lymphocytes Absolute: 2.4 10*3/uL (ref 0.7–3.1)
MCH: 31.3 pg (ref 26.6–33.0)
MCHC: 34.5 g/dL (ref 31.5–35.7)
MCV: 91 fL (ref 79–97)
MONOCYTES: 8 %
Monocytes Absolute: 0.6 10*3/uL (ref 0.1–0.9)
NEUTROS ABS: 4.2 10*3/uL (ref 1.4–7.0)
Neutrophils Relative %: 57 %
Platelets: 368 10*3/uL (ref 150–379)
RBC: 5.05 x10E6/uL (ref 3.77–5.28)
RDW: 13.2 % (ref 12.3–15.4)
RETIC CT PCT: 1.3 % (ref 0.6–2.6)
TIBC: 347 ug/dL (ref 250–450)
UIBC: 235 ug/dL (ref 131–425)
Vitamin B-12: 1197 pg/mL — ABNORMAL HIGH (ref 211–946)
WBC: 7.4 10*3/uL (ref 3.4–10.8)

## 2014-07-20 LAB — NMR, LIPOPROFILE
Cholesterol: 194 mg/dL (ref 100–199)
HDL CHOLESTEROL BY NMR: 39 mg/dL — AB (ref >39)
HDL Particle Number: 28.5 umol/L — ABNORMAL LOW (ref >=30.5)
LDL Particle Number: 1555 nmol/L — ABNORMAL HIGH (ref <1000)
LDL Size: 21 nm (ref >20.5)
LDL-C: 127 mg/dL — ABNORMAL HIGH (ref 0–99)
LP-IR Score: 59 — ABNORMAL HIGH (ref <=45)
SMALL LDL PARTICLE NUMBER: 577 nmol/L — AB (ref <=527)
TRIGLYCERIDES BY NMR: 141 mg/dL (ref 0–149)

## 2014-08-09 ENCOUNTER — Ambulatory Visit
Admission: RE | Admit: 2014-08-09 | Discharge: 2014-08-09 | Disposition: A | Discharge status: Home / Self Care | Payer: BLUE CROSS/BLUE SHIELD | Source: Ambulatory Visit

## 2014-08-09 DIAGNOSIS — Z1231 Encounter for screening mammogram for malignant neoplasm of breast: Secondary | ICD-10-CM

## 2014-08-20 ENCOUNTER — Telehealth: Payer: Self-pay | Admitting: Nurse Practitioner

## 2014-08-20 MED ORDER — PHENTERMINE HCL 37.5 MG PO TABS: 37.5000 mg | ORAL_TABLET | Freq: Every day | ORAL | Status: DC

## 2014-08-20 NOTE — Telephone Encounter (Signed)
Patient aware rx up front ready to be picked up

## 2014-08-20 NOTE — Telephone Encounter (Signed)
rx ready for pick up

## 2014-08-20 NOTE — Telephone Encounter (Signed)
Reviewed labs with pt, pt also requesting a refill on her phentermine?

## 2014-09-15 ENCOUNTER — Ambulatory Visit (INDEPENDENT_AMBULATORY_CARE_PROVIDER_SITE_OTHER): Payer: BLUE CROSS/BLUE SHIELD | Admitting: Family

## 2014-09-15 VITALS — BP 128/79 | HR 104 | Temp 99.4°F | Ht 67.0 in | Wt 170.6 lb

## 2014-09-15 DIAGNOSIS — R05 Cough: Secondary | ICD-10-CM | POA: Diagnosis not present

## 2014-09-15 DIAGNOSIS — J069 Acute upper respiratory infection, unspecified: Secondary | ICD-10-CM

## 2014-09-15 DIAGNOSIS — R059 Cough, unspecified: Secondary | ICD-10-CM

## 2014-09-15 MED ORDER — BENZONATATE 200 MG PO CAPS: 200.0000 mg | ORAL_CAPSULE | Freq: Three times a day (TID) | ORAL | Status: DC | PRN

## 2014-09-15 MED ORDER — AZITHROMYCIN 250 MG PO TABS: ORAL_TABLET | ORAL | Status: DC

## 2014-09-15 NOTE — Progress Notes (Signed)
Subjective:    Patient ID: Maria [name], female    DOB: [birth year], 54 y.o.   MRN: [id number]  Fever  Associated symptoms include coughing and a sore throat. Pertinent negatives include no ear pain, headaches or wheezing.  Cough This is a new problem. The current episode started yesterday. The problem has been gradually worsening. The problem occurs every few minutes. The cough is non-productive. Associated symptoms include chills, a fever, nasal congestion, postnasal drip, rhinorrhea and a sore throat. Pertinent negatives include no ear congestion, ear pain, headaches, hemoptysis, myalgias, shortness of breath or wheezing. The symptoms are aggravated by lying down and pollens. She has tried rest and OTC cough suppressant (Mucinex) for the symptoms. The treatment provided mild relief. Her past medical history is significant for environmental allergies. There is no history of asthma or COPD.  Sore Throat  Associated symptoms include coughing. Pertinent negatives include no ear pain, headaches or shortness of breath.      Review of Systems  Constitutional: Positive for fever and chills.  HENT: Positive for postnasal drip, rhinorrhea and sore throat. Negative for ear pain.   Eyes: Negative.   Respiratory: Positive for cough. Negative for hemoptysis, shortness of breath and wheezing.   Cardiovascular: Negative.  Negative for palpitations.  Gastrointestinal: Negative.   Endocrine: Negative.   Genitourinary: Negative.   Musculoskeletal: Negative.  Negative for myalgias.  Allergic/Immunologic: Positive for environmental allergies.  Neurological: Negative.  Negative for headaches.  Hematological: Negative.   Psychiatric/Behavioral: Negative.   All other systems reviewed and are negative.      Objective:   Physical Exam  Constitutional: She is oriented to person, place, and time. She appears well-developed and well-nourished. No distress.  HENT:  Head: Normocephalic and atraumatic.  Right  Ear: External ear normal.  Left Ear: External ear normal.  Nasal passage erythemas with mild swelling  Oropharynx erythemas   Eyes: Pupils are equal, round, and reactive to light.  Neck: Normal range of motion. Neck supple. No thyromegaly present.  Cardiovascular: Normal rate, regular rhythm, normal heart sounds and intact distal pulses.   No murmur heard. Pulmonary/Chest: Effort normal and breath sounds normal. No respiratory distress. She has no wheezes.  Abdominal: Soft. Bowel sounds are normal. She exhibits no distension. There is no tenderness.  Musculoskeletal: Normal range of motion. She exhibits no edema or tenderness.  Neurological: She is alert and oriented to person, place, and time. She has normal reflexes. No cranial nerve deficit.  Skin: Skin is warm and dry.  Psychiatric: She has a normal mood and affect. Her behavior is normal. Judgment and thought content normal.  Vitals reviewed.   BP 128/79 mmHg  Pulse 104  Temp(Src) 99.4 F (37.4 C) (Oral)  Ht 5' 7"  (1.702 m)  Wt 170 lb 9.6 oz (77.384 kg)  BMI 26.71 kg/m2       Assessment & Plan:  1. Acute upper respiratory infection -- Take meds as prescribed - Use a cool mist humidifier  -Use saline nose sprays frequently -Saline irrigations of the nose can be very helpful if done frequently.  * 4X daily for 1 week*  * Use of a nettie pot can be helpful with this. Follow directions with this* -Force fluids -For any cough or congestion  Use plain Mucinex- regular strength or max strength is fine   * Children- consult with Pharmacist for dosing -For fever or aces or pains- take tylenol or ibuprofen appropriate for age and weight.  * for fevers greater than 101  orally you may alternate ibuprofen and tylenol every  3 hours. -Throat lozenges if help - azithromycin (ZITHROMAX) 250 MG tablet; Take 500 mg once, then 250 mg for four days  Dispense: 6 tablet; Refill: 0 - benzonatate (TESSALON) 200 MG capsule; Take 1 capsule  (200 mg total) by mouth 3 (three) times daily as needed.  Dispense: 30 capsule; Refill: 1  2. Cough - benzonatate (TESSALON) 200 MG capsule; Take 1 capsule (200 mg total) by mouth 3 (three) times daily as needed.  Dispense: 30 capsule; Refill: [place], FNP

## 2014-09-15 NOTE — Patient Instructions (Signed)
Upper Respiratory Infection, Adult An upper respiratory infection (URI) is also sometimes known as the common cold. The upper respiratory tract includes the nose, sinuses, throat, trachea, and bronchi. Bronchi are the airways leading to the lungs. Most people improve within 1 week, but symptoms can last up to 2 weeks. A residual cough may last even longer.  CAUSES Many different viruses can infect the tissues lining the upper respiratory tract. The tissues become irritated and inflamed and often become very moist. Mucus production is also common. A cold is contagious. You can easily spread the virus to others by oral contact. This includes kissing, sharing a glass, coughing, or sneezing. Touching your mouth or nose and then touching a surface, which is then touched by another person, can also spread the virus. SYMPTOMS  Symptoms typically develop 1 to 3 days after you come in contact with a cold virus. Symptoms vary from person to person. They may include:  Runny nose.  Sneezing.  Nasal congestion.  Sinus irritation.  Sore throat.  Loss of voice (laryngitis).  Cough.  Fatigue.  Muscle aches.  Loss of appetite.  Headache.  Low-grade fever. DIAGNOSIS  You might diagnose your own cold based on familiar symptoms, since most people get a cold 2 to 3 times a year. Your caregiver can confirm this based on your exam. Most importantly, your caregiver can check that your symptoms are not due to another disease such as strep throat, sinusitis, pneumonia, asthma, or epiglottitis. Blood tests, throat tests, and X-rays are not necessary to diagnose a common cold, but they may sometimes be helpful in excluding other more serious diseases. Your caregiver will decide if any further tests are required. RISKS AND COMPLICATIONS  You may be at risk for a more severe case of the common cold if you smoke cigarettes, have chronic heart disease (such as heart failure) or lung disease (such as asthma), or if  you have a weakened immune system. The very young and very old are also at risk for more serious infections. Bacterial sinusitis, middle ear infections, and bacterial pneumonia can complicate the common cold. The common cold can worsen asthma and chronic obstructive pulmonary disease (COPD). Sometimes, these complications can require emergency medical care and may be life-threatening. PREVENTION  The best way to protect against getting a cold is to practice good hygiene. Avoid oral or hand contact with people with cold symptoms. Wash your hands often if contact occurs. There is no clear evidence that vitamin C, vitamin E, echinacea, or exercise reduces the chance of developing a cold. However, it is always recommended to get plenty of rest and practice good nutrition. TREATMENT  Treatment is directed at relieving symptoms. There is no cure. Antibiotics are not effective, because the infection is caused by a virus, not by bacteria. Treatment may include:  Increased fluid intake. Sports drinks offer valuable electrolytes, sugars, and fluids.  Breathing heated mist or steam (vaporizer or shower).  Eating chicken soup or other clear broths, and maintaining good nutrition.  Getting plenty of rest.  Using gargles or lozenges for comfort.  Controlling fevers with ibuprofen or acetaminophen as directed by your caregiver.  Increasing usage of your inhaler if you have asthma. Zinc gel and zinc lozenges, taken in the first 24 hours of the common cold, can shorten the duration and lessen the severity of symptoms. Pain medicines may help with fever, muscle aches, and throat pain. A variety of non-prescription medicines are available to treat congestion and runny nose. Your caregiver   can make recommendations and may suggest nasal or lung inhalers for other symptoms.  HOME CARE INSTRUCTIONS   Only take over-the-counter or prescription medicines for pain, discomfort, or fever as directed by your  caregiver.  Use a warm mist humidifier or inhale steam from a shower to increase air moisture. This may keep secretions moist and make it easier to breathe.  Drink enough water and fluids to keep your urine clear or pale yellow.  Rest as needed.  Return to work when your temperature has returned to normal or as your caregiver advises. You may need to stay home longer to avoid infecting others. You can also use a face mask and careful hand washing to prevent spread of the virus. SEEK MEDICAL CARE IF:   After the first few days, you feel you are getting worse rather than better.  You need your caregiver's advice about medicines to control symptoms.  You develop chills, worsening shortness of breath, or brown or red sputum. These may be signs of pneumonia.  You develop yellow or brown nasal discharge or pain in the face, especially when you bend forward. These may be signs of sinusitis.  You develop a fever, swollen neck glands, pain with swallowing, or white areas in the back of your throat. These may be signs of strep throat. SEEK IMMEDIATE MEDICAL CARE IF:   You have a fever.  You develop severe or persistent headache, ear pain, sinus pain, or chest pain.  You develop wheezing, a prolonged cough, cough up blood, or have a change in your usual mucus (if you have chronic lung disease).  You develop sore muscles or a stiff neck. Document Released: 11/25/2000 Document Revised: 08/24/2011 Document Reviewed: 09/06/2013 [employer] Patient Information 2015 [employer]. This information is not intended to replace advice given to you by your health care provider. Make sure you discuss any questions you have with your health care provider.  

## 2014-10-04 ENCOUNTER — Encounter: Payer: Self-pay | Admitting: Nurse Practitioner

## 2014-10-04 ENCOUNTER — Ambulatory Visit (INDEPENDENT_AMBULATORY_CARE_PROVIDER_SITE_OTHER): Payer: BLUE CROSS/BLUE SHIELD | Admitting: Nurse Practitioner

## 2014-10-04 VITALS — BP 134/83 | HR 86 | Temp 98.4°F | Ht 67.0 in | Wt 167.0 lb

## 2014-10-04 DIAGNOSIS — J329 Chronic sinusitis, unspecified: Secondary | ICD-10-CM | POA: Diagnosis not present

## 2014-10-04 MED ORDER — AMOXICILLIN 875 MG PO TABS: 875.0000 mg | ORAL_TABLET | Freq: Two times a day (BID) | ORAL | Status: DC

## 2014-10-04 MED ORDER — BENZONATATE 100 MG PO CAPS: 100.0000 mg | ORAL_CAPSULE | Freq: Three times a day (TID) | ORAL | Status: DC | PRN

## 2014-10-04 NOTE — Patient Instructions (Signed)

## 2014-10-04 NOTE — Progress Notes (Signed)
  Subjective:     Maria [name] is a 54 y.o. female who presents for evaluation of sinus pain. Symptoms include: congestion, cough and post nasal drip. Onset of symptoms was 2 days ago. Symptoms have been unchanged since that time. Past history is significant for no history of pneumonia or bronchitis. Patient is a non-smoker.  The following portions of the patient's history were reviewed and updated as appropriate: allergies, current medications, past family history, past medical history, past social history, past surgical history and problem list.  Review of Systems Ears, nose, mouth, throat, and face: positive for nasal congestion and voice change Respiratory: positive for cough   Objective:    BP 134/83 mmHg  Pulse 86  Temp(Src) 98.4 F (36.9 C) (Oral)  Ht 5' 7"  (1.702 m)  Wt 167 lb (75.751 kg)  BMI 26.15 kg/m2 General appearance: alert and combative Eyes: conjunctivae/corneas clear. PERRL, EOM's intact. Fundi benign. Ears: normal TM's and external ear canals both ears Nose: mild congestion, turbinates red, no sinus tenderness Throat: lips, mucosa, and tongue normal; teeth and gums normal Lungs: clear to auscultation bilaterally and dry cough Heart: regular rate and rhythm, S1, S2 normal, no murmur, click, rub or gallop    Assessment:    Acute recurrent sinusitis.    Plan:  1. Take meds as prescribed 2. Use a cool mist humidifier especially during the winter months and when heat has been humid. 3. Use saline nose sprays frequently 4. Saline irrigations of the nose can be very helpful if done frequently.  * 4X daily for 1 week*  * Use of a nettie pot can be helpful with this. Follow directions with this* 5. Drink plenty of fluids 6. Keep thermostat turn down low 7.For any cough or congestion  Use plain Mucinex- regular strength or max strength is fine   * Children- consult with Pharmacist for dosing 8. For fever or aces or pains- take tylenol or ibuprofen appropriate for  age and weight.  * for fevers greater than 101 orally you may alternate ibuprofen and tylenol every  3 hours.   Meds ordered this encounter  Medications  . amoxicillin (AMOXIL) 875 MG tablet    Sig: Take 1 tablet (875 mg total) by mouth 2 (two) times daily. 1 po BID    Dispense:  20 tablet    Refill:  0    Order Specific Question:  Supervising Provider    Answer:  Chipper Herb [[id number]]  . benzonatate (TESSALON PERLES) 100 MG capsule    Sig: Take 1 capsule (100 mg total) by mouth 3 (three) times daily as needed for cough.    Dispense:  20 capsule    Refill:  0    Order Specific Question:  Supervising Provider    Answer:  Chipper Herb Ama, FNP

## 2014-10-22 ENCOUNTER — Encounter: Payer: Self-pay | Admitting: Internal Medicine

## 2014-11-21 ENCOUNTER — Other Ambulatory Visit: Payer: Self-pay | Admitting: Physician Assistant

## 2014-11-28 ENCOUNTER — Encounter: Payer: BLUE CROSS/BLUE SHIELD | Admitting: Internal Medicine

## 2015-01-03 ENCOUNTER — Ambulatory Visit (INDEPENDENT_AMBULATORY_CARE_PROVIDER_SITE_OTHER): Payer: BLUE CROSS/BLUE SHIELD | Admitting: Nurse Practitioner

## 2015-01-03 ENCOUNTER — Encounter: Payer: Self-pay | Admitting: Nurse Practitioner

## 2015-01-03 VITALS — BP 110/69 | HR 82 | Temp 97.4°F | Ht 67.0 in | Wt 172.0 lb

## 2015-01-03 DIAGNOSIS — M949 Disorder of cartilage, unspecified: Secondary | ICD-10-CM

## 2015-01-03 DIAGNOSIS — K900 Celiac disease: Secondary | ICD-10-CM

## 2015-01-03 DIAGNOSIS — E785 Hyperlipidemia, unspecified: Secondary | ICD-10-CM

## 2015-01-03 DIAGNOSIS — D509 Iron deficiency anemia, unspecified: Secondary | ICD-10-CM | POA: Diagnosis not present

## 2015-01-03 DIAGNOSIS — K219 Gastro-esophageal reflux disease without esophagitis: Secondary | ICD-10-CM | POA: Diagnosis not present

## 2015-01-03 DIAGNOSIS — I10 Essential (primary) hypertension: Secondary | ICD-10-CM

## 2015-01-03 DIAGNOSIS — M899 Disorder of bone, unspecified: Secondary | ICD-10-CM

## 2015-01-03 MED ORDER — TRIAMTERENE-HCTZ 75-50 MG PO TABS: 1.0000 | ORAL_TABLET | Freq: Every day | ORAL | Status: DC

## 2015-01-03 NOTE — Progress Notes (Signed)
   Subjective:    Patient ID: Maria [name], female    DOB: [birth year], 54 y.o.   MRN: [id number]  Hypertension This is a chronic problem. The current episode started more than 1 year ago. The problem is controlled. Pertinent negatives include no chest pain, headaches, malaise/fatigue, palpitations or peripheral edema. Risk factors for coronary artery disease include dyslipidemia. Past treatments include diuretics. The current treatment provides moderate improvement. There is no history of CAD/MI, CVA or retinopathy.  Hyperlipidemia This is a chronic problem. The current episode started more than 1 year ago. The problem is controlled. Recent lipid tests were reviewed and are normal. She has no history of diabetes, hypothyroidism or obesity. Pertinent negatives include no chest pain. Current antihyperlipidemic treatment includes statins. The current treatment provides moderate improvement of lipids. Compliance problems include adherence to diet and adherence to exercise.  Risk factors for coronary artery disease include dyslipidemia, hypertension and post-menopausal.  GERD Hasn't had any symptoms in awhile. Celiac disease Manages it with diet- no recent flare ups Hx iron deficiency anemia  Has not had recent labs checked- no c/o ftigue     Review of Systems  Constitutional: Negative.  Negative for malaise/fatigue.  HENT: Negative.   Respiratory: Negative.   Cardiovascular: Negative.  Negative for chest pain and palpitations.  Genitourinary: Negative.   Neurological: Negative.  Negative for headaches.  Psychiatric/Behavioral: Negative.   All other systems reviewed and are negative.      Objective:   Physical Exam  Constitutional: She is oriented to person, place, and time. She appears well-developed and well-nourished.  HENT:  Nose: Nose normal.  Mouth/Throat: Oropharynx is clear and moist.  Eyes: EOM are normal.  Neck: Trachea normal, normal range of motion and full passive range of  motion without pain. Neck supple. No JVD present. Carotid bruit is not present. No thyromegaly present.  Cardiovascular: Normal rate, regular rhythm, normal heart sounds and intact distal pulses.  Exam reveals no gallop and no friction rub.   No murmur heard. Pulmonary/Chest: Effort normal and breath sounds normal.  Abdominal: Soft. Bowel sounds are normal. She exhibits no distension and no mass. There is no tenderness.  Musculoskeletal: Normal range of motion.  Lymphadenopathy:    She has no cervical adenopathy.  Neurological: She is alert and oriented to person, place, and time. She has normal reflexes.  Skin: Skin is warm and dry.  Psychiatric: She has a normal mood and affect. Her behavior is normal. Judgment and thought content normal.   BP 110/69 mmHg  Pulse 82  Temp(Src) 97.4 F (36.3 C) (Oral)  Ht 5' 7"  (1.702 m)  Wt 172 lb (78.019 kg)  BMI 26.93 kg/m2        Assessment & Plan:  1. Essential hypertension Do not add salt to diet - triamterene-hydrochlorothiazide (MAXZIDE) 75-50 MG per tablet; Take 1 tablet by mouth daily.  Dispense: 90 tablet; Refill: 1 - CMP14+EGFR  2. Gastroesophageal reflux disease without esophagitis Avoid spicy foods Do not eat 2 hours prior to bedtime   3. Celiac disease Continue gluten free diet  4. Iron deficiency anemia   5. Hyperlipidemia with target LDL less than 100 Low ffta diet - Lipid panel  6. Disorder of bone and cartilage Weight bearing exercises    Labs pending Health maintenance reviewed Diet and exercise encouraged Continue all meds Follow up  In 3 months   [place], FNP

## 2015-01-03 NOTE — Patient Instructions (Signed)
Gluten-Free Diet for Celiac Disease Gluten is a protein found in wheat, rye, barley, and triticale (a cross between wheat and rye) grains. People with celiac disease need to have a gluten-free diet. With celiac disease, gluten interferes with the absorption of food and may also cause intestinal injury.  Strict compliance is important even during symptom-free periods. This means eliminating all foods with gluten from your diet permanently. This requires some significant changes but is very manageable. WHAT DO I NEED TO KNOW ABOUT A GLUTEN-FREE DIET?  Look for items labeled with "GF." Looking for GF will make it easier to identify products that are safe to eat.  Read all labels. Gluten may have been added as a minor ingredient where least expected, such as in shredded cheeses or ice creams. Always check food labels and investigate questionable ingredients. Talk to your dietitian or health care provider if you have questions about certain foods or need help finding GF foods.  Check when in doubt. If you are not sure whether an ingredient contains gluten, check with the manufacturer. Note that some manufacturers may change ingredients without notice. Always read labels.   Know how food is prepared. Since flour and cereal products are often used in the preparation of foods, it is important to be aware of the methods of preparation used, as well as the ingredients in the foods themselves. This is especially true when you are dining out. Ask restaurants if they have a gluten-free menu.  Watch for cross-contamination. Cross-contamination occurs when gluten-free foods come into contact with foods that contain gluten. It often happens during the manufacturing process. Always check the ingredient list and for warnings on packages, such as "may contain gluten."  Eat a balanced diet. It is important to still get enough fiber, iron, and B vitamins in your diet. Look for enriched whole grain gluten-free products  and continue to eat a well-balanced diet of the important non-grain items, such as vegetables, fruit, lean proteins, legumes, and dairy.  Consider taking a gluten-free multivitamin and mineral supplement. Discuss this with your health care provider. WHAT KEY WORDS HELP IDENTIFY GLUTEN? Know key words to help identify gluten. A dietitian can help you identify possible harmful ingredients in the foods you normally eat. Words to check for on food labels include:   Flour, enriched flour, bromated flour, white flour, durum flour, graham flour, phosphated flour, self-rising flour, semolina, or farina.  Starch, dextrin, modified food starch, or cereal.  Thickening, fillers, or emulsifiers.  Any kind of malt flavoring, extract, or syrup (malt is made from barley and includes malt vinegar, malted milk, and malted beverages).  Hydrolyzed vegetable protein. WHAT FOODS CAN I EAT? Below is a list of common foods that are allowed with a gluten-free diet.  Grains Products made from the following flours or grains:amaranth,bean flours, 100% buckwheat flour, corn, millet, nut flours or meals, GF oats, quinoa, rice, sorghum, teff, any all-purpose 100% GF flour mix, rice wafers, pure cornmeal tortillas, popcorn, some crackers, some chips, and hot cereals made from cornmeal. Ask your dietitian which specific hot and cold cereals are allowed. Hominy, rice or wild rice, and special GF pasta. Some Asian rice noodles or bean noodles. Arrowroot starch, corn bran, corn flour, corn germ, cornmeal, corn starch, potato flour, potato starch flour, and rice bran. Rice flours: plain, brown, and sweet. Rice polish, soy flour, tapioca starch. Vegetables All plain, fresh, frozen, or canned vegetables.  Fruits All fresh, frozen, canned, dried fruits, and fruit juices.  Meats and Other  Protein Foods Meat, fish, poultry, or eggs prepared without added wheat, rye, barley, or triticale. Some luncheon meat and some frankfurters.  Pure meat. All aged cheese, most processed cheese products, some cottage cheese, and some cream cheese. Dried beans, dried peas, and lentils.  Dairy Milk and yogurt made with allowed ingredients.  Beverages Coffee (regular or decaffeinated), tea, herbal tea (read label to be sure that no wheat flour has been added). Carbonated beverages and some root beers. Wine, sake, and distilled spirits, such as gin, vodka, and whiskey. GF beers and GF ciders.  Sweetsand Desserts Sugar, honey, some syrups, molasses, jelly, jam, plain hard candy, marshmallows, gumdrops, homemade candies free of wheat, rye, barley, or triticale. Coconut. Custard, some pudding mixes, and homemade puddings from cornstarch, rice, and tapioca. Gelatin desserts, sorbets, frozen ice pops, and sherbet. Cake, cookies, and other desserts prepared with allowed flours. Some commercial ice creams. Ask your dietitian about specific brands of dessert that are allowed.  Fats and Oils Butter, margarine, vegetable oil, sour cream not containing modified food starch, whipping cream, shortening, lard, cream, and some mayonnaise. Some commercial salad dressings. Peanut butter.  Other Homemade broth and soups made with allowed ingredients; some canned or frozen soups. Any other combination or prepared foods that do not contain gluten. Monosodium glutamate (MSG). Cider, rice, and wine vinegar. Baking soda and baking powder. Certain soy sauces (Tamari). Ask your dietitian about specific brands that are allowed. Nuts, coconut, chocolate, and pure cocoa powder. Salt, pepper, herbs, spices, extracts, and food colorings. The items listed above may not be a complete list of allowed foods or beverages. Contact your dietitian for more options.  WHAT FOODS CAN I NOT EAT? Below is a list of common foods that are not allowed with a gluten-free diet.  Grains Barley, bran, bulgur, cracked wheat, graham, malt, matzo, wheat germ, and all wheat and rye cereals  including spelt and kamut. Avoid cereals containing malt as a flavoring, such as rice cereal. Also avoid regular noodles, spaghetti, macaroni, and most packaged rice mixes, and all others containing wheat, rye, barley, or triticale.  Vegetables Most creamed vegetables, most vegetables canned in sauces, and any vegetables prepared with wheat, rye, barley, or triticale.  Fruits Thickened or prepared fruits and some pie fillings.  Meats and Other Protein Sources Any meat or meat alternative containing wheat, rye, barley, or gluten stabilizers (such as some hot dogs, salami, cold cuts, or sausage). Bread-containing products, such as Swiss steak, croquettes, and meatloaf. Most tuna canned in vegetable broth, [place] with hydrolyzed vegetable protein (HVP) injected as part of the basting, and any cheese product containing oat gum as an ingredient. Seitan. Imitation fish. Dairy Commercial chocolate milk, which may have cereal added, and malted milk. Beverages Certain cereal beverages. Beer and ciders (unless GF), ale, malted milk, and some root beers. Sweetsand Desserts Commercial candies containing wheat, rye, barley, or triticale. Certain toffees are dusted with wheat flour. Chocolate-coated nuts, which are often rolled in flour. Cakes, cookies, doughnuts, and pastries that are prepared with wheat, barley, rye, or triticale flour. Some commercial ice creams, ice cream flavors which contain cookies, crumbs, or cheesecake. Ice cream cones. Commercially prepared mixes for cakes, cookies, and other desserts unless marked GF. Bread pudding and other puddings thickened with flour. Fats and Oils Some commercial salad dressings and sour cream containing modified food starch.  Condiments Some curry powder, some dry seasoning mixes, some gravy extracts, some meat sauces, some ketchup, some prepared mustard, horseradish. Other All soups containing wheat,  rye, barley, or triticale flour. Bouillon and bouillon  cubes that contain HVP. Combination or prepared foods that contain gluten. Some soy sauce, some chip dips, and some chewing gum. Yeast extract (contains barley). Caramel color (may contain malt). The items listed above may not be a complete list of foods and beverages to avoid. Contact your dietitian for more information. Document Released: 06/01/2005 Document Revised: 10/16/2013 Document Reviewed: 04/05/2013 [facility] Patient Information 2015 [place], [place]. This information is not intended to replace advice given to you by your health care provider. Make sure you discuss any questions you have with your health care provider.

## 2015-01-04 LAB — CMP14+EGFR
A/G RATIO: 1.4 (ref 1.1–2.5)
ALBUMIN: 4 g/dL (ref 3.5–5.5)
ALK PHOS: 117 IU/L (ref 39–117)
ALT: 28 IU/L (ref 0–32)
AST: 23 IU/L (ref 0–40)
BILIRUBIN TOTAL: 0.5 mg/dL (ref 0.0–1.2)
BUN/Creatinine Ratio: 23 (ref 9–23)
BUN: 17 mg/dL (ref 6–24)
CALCIUM: 8.9 mg/dL (ref 8.7–10.2)
CHLORIDE: 100 mmol/L (ref 97–108)
CO2: 25 mmol/L (ref 18–29)
Creatinine, Ser: 0.75 mg/dL (ref 0.57–1.00)
GFR, EST AFRICAN AMERICAN: 105 mL/min/{1.73_m2} (ref >59)
GFR, EST NON AFRICAN AMERICAN: 91 mL/min/{1.73_m2} (ref >59)
GLOBULIN, TOTAL: 2.9 g/dL (ref 1.5–4.5)
Glucose: 100 mg/dL — ABNORMAL HIGH (ref 65–99)
Potassium: 4.1 mmol/L (ref 3.5–5.2)
SODIUM: 142 mmol/L (ref 134–144)
TOTAL PROTEIN: 6.9 g/dL (ref 6.0–8.5)

## 2015-01-04 LAB — LIPID PANEL
CHOL/HDL RATIO: 4.4 ratio (ref 0.0–4.4)
CHOLESTEROL TOTAL: 197 mg/dL (ref 100–199)
HDL: 45 mg/dL (ref >39)
LDL Calculated: 126 mg/dL — ABNORMAL HIGH (ref 0–99)
Triglycerides: 129 mg/dL (ref 0–149)
VLDL Cholesterol Cal: 26 mg/dL (ref 5–40)

## 2015-01-22 ENCOUNTER — Encounter: Payer: Self-pay | Admitting: Physician Assistant

## 2015-01-22 ENCOUNTER — Ambulatory Visit (INDEPENDENT_AMBULATORY_CARE_PROVIDER_SITE_OTHER): Payer: BLUE CROSS/BLUE SHIELD | Admitting: Physician Assistant

## 2015-01-22 VITALS — BP 104/69 | HR 79 | Temp 97.4°F | Ht 67.0 in | Wt 173.0 lb

## 2015-01-22 DIAGNOSIS — M25561 Pain in right knee: Secondary | ICD-10-CM | POA: Diagnosis not present

## 2015-01-22 MED ORDER — PREDNISONE 10 MG (21) PO TBPK: ORAL_TABLET | ORAL | Status: DC

## 2015-01-22 MED ORDER — DICLOFENAC SODIUM 75 MG PO TBEC: 75.0000 mg | DELAYED_RELEASE_TABLET | Freq: Two times a day (BID) | ORAL | Status: DC

## 2015-01-22 NOTE — Progress Notes (Signed)
Subjective:    Patient ID: Maria [name], female    DOB: 11-Jun-1961, 54 y.o.   MRN: [id number]  HPI 54 y/o female presents with c/o constant worsening right knee pain x 5 days. She denies any trauma. She has been taking ibuprofen 400mg  TID with some relief. He knee woke her up last night.   Pain is worse with walking up and down steps, bending. Pain stays localized to the knee Possible mild swelling    Review of Systems  Constitutional: Negative.   HENT: Negative.   Eyes: Negative.   Respiratory: Negative.   Cardiovascular: Negative.   Gastrointestinal: Negative.   Musculoskeletal: Positive for arthralgias (right knee ).  Neurological: Negative.        Objective:   Physical Exam  Constitutional: She is oriented to person, place, and time. She appears well-developed and well-nourished.  Musculoskeletal: She exhibits edema (trace edema on right patella ) and tenderness (mild ttp directely over right patella ).  Negative varus and valgus Negative joint instability of right knee Mild joint line tenderness on medial superior pole of patella   Neurological: She is alert and oriented to person, place, and time.  Skin: No rash noted. No erythema. No pallor.  Psychiatric: She has a normal mood and affect. Her behavior is normal. Judgment and thought content normal.  Nursing note and vitals reviewed.         Assessment & Plan:  1. Right knee pain  - diclofenac (VOLTAREN) 75 MG EC tablet; Take 1 tablet (75 mg total) by mouth 2 (two) times daily.  Dispense: 30 tablet; Refill: 0 - predniSONE (STERAPRED UNI-PAK 21 TAB) 10 MG (21) TBPK tablet; 6 pills PO on day 1, 5 on day 2, 4 on day 3, 3 on day 4, 2 on day 5, 1 on day 6  Dispense: 21 tablet; Refill: 0   No ibuprofen   rtc 2 weeks   Alanzo Lamb A. Chauncey Reading PA-C

## 2015-01-22 NOTE — Patient Instructions (Signed)

## 2015-02-06 ENCOUNTER — Ambulatory Visit: Payer: BLUE CROSS/BLUE SHIELD | Admitting: Physician Assistant

## 2015-03-08 ENCOUNTER — Other Ambulatory Visit: Payer: BLUE CROSS/BLUE SHIELD | Admitting: Gastroenterology

## 2015-03-08 LAB — HM COLONOSCOPY: HM Colonoscopy: NORMAL

## 2015-05-02 ENCOUNTER — Encounter: Payer: Self-pay | Admitting: *Deleted

## 2015-06-14 ENCOUNTER — Encounter: Payer: Self-pay | Admitting: Nurse Practitioner

## 2015-06-14 ENCOUNTER — Telehealth: Payer: Self-pay | Admitting: Nurse Practitioner

## 2015-06-14 ENCOUNTER — Ambulatory Visit (INDEPENDENT_AMBULATORY_CARE_PROVIDER_SITE_OTHER): Payer: BLUE CROSS/BLUE SHIELD | Admitting: Nurse Practitioner

## 2015-06-14 VITALS — BP 110/76 | HR 91 | Temp 97.8°F | Ht 67.0 in | Wt 176.8 lb

## 2015-06-14 DIAGNOSIS — J0101 Acute recurrent maxillary sinusitis: Secondary | ICD-10-CM

## 2015-06-14 MED ORDER — AZITHROMYCIN 250 MG PO TABS: ORAL_TABLET | ORAL | Status: DC

## 2015-06-14 NOTE — Patient Instructions (Signed)

## 2015-06-14 NOTE — Progress Notes (Signed)
  Subjective:     Maria [name] is a 54 y.o. female who presents for evaluation of sinus pain. Symptoms include: congestion, headaches, nasal congestion and sinus pressure. Onset of symptoms was 4 days ago. Symptoms have been gradually worsening since that time. Past history is significant for no history of pneumonia or bronchitis. Patient is a non-smoker.  The following portions of the patient's history were reviewed and updated as appropriate: allergies, current medications, past family history, past medical history, past social history, past surgical history and problem list.  Review of Systems Pertinent items are noted in HPI.   Objective:    BP 110/76 mmHg  Pulse 91  Temp(Src) 97.8 F (36.6 C) (Oral)  Ht 5' 7"  (1.702 m)  Wt 176 lb 12.8 oz (80.196 kg)  BMI 27.68 kg/m2 General appearance: alert and cooperative Eyes: conjunctivae/corneas clear. PERRL, EOM's intact. Fundi benign. Ears: normal TM's and external ear canals both ears Nose: copious and purulent discharge, moderate congestion, sinus tenderness bilateral Throat: lips, mucosa, and tongue normal; teeth and gums normal Neck: no adenopathy, no carotid bruit, no JVD, supple, symmetrical, trachea midline and thyroid not enlarged, symmetric, no tenderness/mass/nodules Lungs: clear to auscultation bilaterally Heart: regular rate and rhythm, S1, S2 normal, no murmur, click, rub or gallop    Assessment:    Acute bacterial sinusitis.    Plan:   1. Take meds as prescribed 2. Use a cool mist humidifier especially during the winter months and when heat has been humid. 3. Use saline nose sprays frequently 4. Saline irrigations of the nose can be very helpful if done frequently.  * 4X daily for 1 week*  * Use of a nettie pot can be helpful with this. Follow directions with this* 5. Drink plenty of fluids 6. Keep thermostat turn down low 7.For any cough or congestion  Use plain Mucinex- regular strength or max strength is fine   *  Children- consult with Pharmacist for dosing 8. For fever or aces or pains- take tylenol or ibuprofen appropriate for age and weight.  * for fevers greater than 101 orally you may alternate ibuprofen and tylenol every  3 hours.   Meds ordered this encounter  Medications  . azithromycin (ZITHROMAX) 250 MG tablet    Sig: Two tablets day one, then one tablet daily next 4 days.    Dispense:  6 tablet    Refill:  0    Order Specific Question:  Supervising Provider    Answer:  Chipper Herb [[id number]]   Mary-Margaret Hassell Done, FNP

## 2015-06-14 NOTE — Telephone Encounter (Signed)
Appt given today per patients request

## 2015-08-17 ENCOUNTER — Other Ambulatory Visit: Payer: Self-pay | Admitting: Nurse Practitioner

## 2015-08-19 NOTE — Telephone Encounter (Signed)
rx ready for pick up

## 2015-08-19 NOTE — Telephone Encounter (Signed)
Patient would like refill of Adipex, please advise

## 2015-08-19 NOTE — Telephone Encounter (Signed)
Pt aware written rx is at front desk ready for pickup

## 2015-09-10 ENCOUNTER — Ambulatory Visit (INDEPENDENT_AMBULATORY_CARE_PROVIDER_SITE_OTHER): Payer: BLUE CROSS/BLUE SHIELD

## 2015-09-10 ENCOUNTER — Ambulatory Visit (INDEPENDENT_AMBULATORY_CARE_PROVIDER_SITE_OTHER): Payer: BLUE CROSS/BLUE SHIELD | Admitting: Nurse Practitioner

## 2015-09-10 ENCOUNTER — Ambulatory Visit: Payer: BLUE CROSS/BLUE SHIELD

## 2015-09-10 ENCOUNTER — Encounter: Payer: Self-pay | Admitting: Nurse Practitioner

## 2015-09-10 VITALS — BP 116/76 | HR 91 | Temp 97.4°F | Ht 67.0 in | Wt 175.0 lb

## 2015-09-10 DIAGNOSIS — I10 Essential (primary) hypertension: Secondary | ICD-10-CM

## 2015-09-10 DIAGNOSIS — Z6827 Body mass index (BMI) 27.0-27.9, adult: Secondary | ICD-10-CM

## 2015-09-10 DIAGNOSIS — Z01419 Encounter for gynecological examination (general) (routine) without abnormal findings: Secondary | ICD-10-CM

## 2015-09-10 DIAGNOSIS — K219 Gastro-esophageal reflux disease without esophagitis: Secondary | ICD-10-CM

## 2015-09-10 DIAGNOSIS — Z0000 Encounter for general adult medical examination without abnormal findings: Secondary | ICD-10-CM

## 2015-09-10 DIAGNOSIS — Z1159 Encounter for screening for other viral diseases: Secondary | ICD-10-CM

## 2015-09-10 DIAGNOSIS — Z13820 Encounter for screening for osteoporosis: Secondary | ICD-10-CM

## 2015-09-10 DIAGNOSIS — D509 Iron deficiency anemia, unspecified: Secondary | ICD-10-CM

## 2015-09-10 DIAGNOSIS — K900 Celiac disease: Secondary | ICD-10-CM

## 2015-09-10 DIAGNOSIS — Z1212 Encounter for screening for malignant neoplasm of rectum: Secondary | ICD-10-CM

## 2015-09-10 DIAGNOSIS — E785 Hyperlipidemia, unspecified: Secondary | ICD-10-CM

## 2015-09-10 LAB — URINALYSIS, COMPLETE
BILIRUBIN UA: NEGATIVE
Glucose, UA: NEGATIVE
Ketones, UA: NEGATIVE
Nitrite, UA: NEGATIVE
Protein, UA: NEGATIVE
Specific Gravity, UA: 1.02 (ref 1.005–1.030)
Urobilinogen, Ur: 0.2 mg/dL (ref 0.2–1.0)
pH, UA: 7 (ref 5.0–7.5)

## 2015-09-10 LAB — MICROSCOPIC EXAMINATION
Bacteria, UA: NONE SEEN
Epithelial Cells (non renal): >10 /hpf — AB (ref 0–10)

## 2015-09-10 MED ORDER — TRIAMTERENE-HCTZ 75-50 MG PO TABS: 1.0000 | ORAL_TABLET | Freq: Every day | ORAL | Status: DC

## 2015-09-10 NOTE — Progress Notes (Signed)
Subjective:    Patient ID: Maria [name], female    DOB: 1961/01/05, 55 y.o.   MRN: [id number]  Patient here today for follow up of chronic medical problems.  Outpatient Encounter Prescriptions as of 09/10/2015  Medication Sig  . phentermine (ADIPEX-P) 37.5 MG tablet TAKE ONE TABLET BY MOUTH ONCE DAILY BEFORE BREAKFAST  . triamterene-hydrochlorothiazide (MAXZIDE) 75-50 MG tablet Take 1 tablet by mouth daily.              Hypertension This is a chronic problem. The current episode started more than 1 year ago. The problem is controlled. Pertinent negatives include no chest pain, headaches, malaise/fatigue, palpitations or peripheral edema. Risk factors for coronary artery disease include dyslipidemia. Past treatments include diuretics. The current treatment provides moderate improvement. There is no history of CAD/MI, CVA or retinopathy.  Hyperlipidemia This is a chronic problem. The current episode started more than 1 year ago. The problem is controlled. Recent lipid tests were reviewed and are normal. She has no history of diabetes, hypothyroidism or obesity. Pertinent negatives include no chest pain. Current antihyperlipidemic treatment includes statins. The current treatment provides moderate improvement of lipids. Compliance problems include adherence to diet and adherence to exercise.  Risk factors for coronary artery disease include dyslipidemia, hypertension and post-menopausal.  GERD Hasn't had any symptoms in awhile. Celiac disease Manages it with diet- no recent flare ups Hx iron deficiency anemia  Has not had recent labs checked- no c/o ftigue     Review of Systems  Constitutional: Negative.  Negative for malaise/fatigue.  HENT: Negative.   Respiratory: Negative.   Cardiovascular: Negative.  Negative for chest pain and palpitations.  Genitourinary: Negative.   Neurological: Negative.  Negative for headaches.  Psychiatric/Behavioral: Negative.   All other systems  reviewed and are negative.      Objective:   Physical Exam  Constitutional: She is oriented to person, place, and time. She appears well-developed and well-nourished.  HENT:  Head: Normocephalic.  Right Ear: Hearing, tympanic membrane, external ear and ear canal normal.  Left Ear: Hearing, tympanic membrane, external ear and ear canal normal.  Nose: Nose normal.  Mouth/Throat: Uvula is midline and oropharynx is clear and moist.  Eyes: Conjunctivae and EOM are normal. Pupils are equal, round, and reactive to light.  Neck: Trachea normal, normal range of motion and full passive range of motion without pain. Neck supple. No JVD present. Carotid bruit is not present. No thyroid mass and no thyromegaly present.  Cardiovascular: Normal rate, regular rhythm, normal heart sounds and intact distal pulses.  Exam reveals no gallop and no friction rub.   No murmur heard. Pulmonary/Chest: Effort normal and breath sounds normal. Right breast exhibits no inverted nipple, no mass, no nipple discharge, no skin change and no tenderness. Left breast exhibits no inverted nipple, no mass, no nipple discharge, no skin change and no tenderness.  Abdominal: Soft. Bowel sounds are normal. She exhibits no distension and no mass. There is no tenderness.  Genitourinary: Vagina normal and uterus normal. No breast swelling, tenderness, discharge or bleeding.  bimanual exam-No adnexal masses or tenderness. Cervix parous and oink- no vaginal discharge  Musculoskeletal: Normal range of motion.  Lymphadenopathy:    She has no cervical adenopathy.  Neurological: She is alert and oriented to person, place, and time. She has normal reflexes.  Skin: Skin is warm and dry.  Psychiatric: She has a normal mood and affect. Her behavior is normal. Judgment and thought content normal.    BP  116/76 mmHg  Pulse 91  Temp(Src) 97.4 F (36.3 C) (Oral)  Ht 5' 7"  (1.702 m)  Wt 175 lb (79.379 kg)  BMI 27.40 kg/m2  Chest xray- no  cardiopulmonary abnormalities-Preliminary reading by Ronnald Collum, FNP  [facility]  EKG- Kerry Hough, FNP        Assessment & Plan:  1. Annual physical exam - anemia profile - Urinalysis, Complete - Thyroid Panel With TSH - VITAMIN D 25 Hydroxy (Vit-D Deficiency, Fractures)  2. Encounter for routine gynecological examination - Pap IG w/ reflex to HPV when ASC-U  3. Essential hypertension Do not add salt to diet - CMP14+EGFR - triamterene-hydrochlorothiazide (MAXZIDE) 75-50 MG tablet; Take 1 tablet by mouth daily.  Dispense: 90 tablet; Refill: 1  4. Gastroesophageal reflux disease without esophagitis Avoid spicy foods Do not eat 2 hours prior to bedtime  5. Celiac disease Continue to watch diet  6. Iron deficiency anemia Continue multivitamin daily  7. Hyperlipidemia with target LDL less than 100 Low fat diet - Lipid panel  8. BMI 27.0-27.9,adult Discussed diet and exercise for person with BMI >25 Will recheck weight in 3-6 months  9. Screening for malignant neoplasm of the rectum - Fecal occult blood, imunochemical; Future  10. Need for hepatitis C screening test - Hepatitis C antibody    Labs pending Health maintenance reviewed Diet and exercise encouraged Continue all meds Follow up  In 6 months   [place], FNP

## 2015-09-10 NOTE — Patient Instructions (Signed)
Health Maintenance, Female Adopting a healthy lifestyle and getting preventive care can go a long way to promote health and wellness. Talk with your health care provider about what schedule of regular examinations is right for you. This is a good chance for you to check in with your provider about disease prevention and staying healthy. In between checkups, there are plenty of things you can do on your own. Experts have done a lot of research about which lifestyle changes and preventive measures are most likely to keep you healthy. Ask your health care provider for more information. WEIGHT AND DIET  Eat a healthy diet  Be sure to include plenty of vegetables, fruits, low-fat dairy products, and lean protein.  Do not eat a lot of foods high in solid fats, added sugars, or salt.  Get regular exercise. This is one of the most important things you can do for your health.  Most adults should exercise for at least 150 minutes each week. The exercise should increase your heart rate and make you sweat (moderate-intensity exercise).  Most adults should also do strengthening exercises at least twice a week. This is in addition to the moderate-intensity exercise.  Maintain a healthy weight  Body mass index (BMI) is a measurement that can be used to identify possible weight problems. It estimates body fat based on height and weight. Your health care provider can help determine your BMI and help you achieve or maintain a healthy weight.  For females 83 years of age and older:   A BMI below 18.5 is considered underweight.  A BMI of 18.5 to 24.9 is normal.  A BMI of 25 to 29.9 is considered overweight.  A BMI of 30 and above is considered obese.  Watch levels of cholesterol and blood lipids  You should start having your blood tested for lipids and cholesterol at 56 years of age, then have this test every 5 years.  You may need to have your cholesterol levels checked more often if:  Your lipid  or cholesterol levels are high.  You are older than 56 years of age.  You are at high risk for heart disease.  CANCER SCREENING   Lung Cancer  Lung cancer screening is recommended for adults 52-81 years old who are at high risk for lung cancer because of a history of smoking.  A yearly low-dose CT scan of the lungs is recommended for people who:  Currently smoke.  Have quit within the past 15 years.  Have at least a 30-pack-year history of smoking. A pack year is smoking an average of one pack of cigarettes a day for 1 year.  Yearly screening should continue until it has been 15 years since you quit.  Yearly screening should stop if you develop a health problem that would prevent you from having lung cancer treatment.  Breast Cancer  Practice breast self-awareness. This means understanding how your breasts normally appear and feel.  It also means doing regular breast self-exams. Let your health care provider know about any changes, no matter how small.  If you are in your 20s or 30s, you should have a clinical breast exam (CBE) by a health care provider every 1-3 years as part of a regular health exam.  If you are 59 or older, have a CBE every year. Also consider having a breast X-ray (mammogram) every year.  If you have a family history of breast cancer, talk to your health care provider about genetic screening.  If you  are at high risk for breast cancer, talk to your health care provider about having an MRI and a mammogram every year.  Breast cancer gene (BRCA) assessment is recommended for women who have family members with BRCA-related cancers. BRCA-related cancers include:  Breast.  Ovarian.  Tubal.  Peritoneal cancers.  Results of the assessment will determine the need for genetic counseling and BRCA1 and BRCA2 testing. Cervical Cancer Your health care provider may recommend that you be screened regularly for cancer of the pelvic organs (ovaries, uterus, and  vagina). This screening involves a pelvic examination, including checking for microscopic changes to the surface of your cervix (Pap test). You may be encouraged to have this screening done every 3 years, beginning at age 21.  For women ages 30-65, health care providers may recommend pelvic exams and Pap testing every 3 years, or they may recommend the Pap and pelvic exam, combined with testing for human papilloma virus (HPV), every 5 years. Some types of HPV increase your risk of cervical cancer. Testing for HPV may also be done on women of any age with unclear Pap test results.  Other health care providers may not recommend any screening for nonpregnant women who are considered low risk for pelvic cancer and who do not have symptoms. Ask your health care provider if a screening pelvic exam is right for you.  If you have had past treatment for cervical cancer or a condition that could lead to cancer, you need Pap tests and screening for cancer for at least 20 years after your treatment. If Pap tests have been discontinued, your risk factors (such as having a new sexual partner) need to be reassessed to determine if screening should resume. Some women have medical problems that increase the chance of getting cervical cancer. In these cases, your health care provider may recommend more frequent screening and Pap tests. Colorectal Cancer  This type of cancer can be detected and often prevented.  Routine colorectal cancer screening usually begins at 56 years of age and continues through 56 years of age.  Your health care provider may recommend screening at an earlier age if you have risk factors for colon cancer.  Your health care provider may also recommend using home test kits to check for hidden blood in the stool.  A small camera at the end of a tube can be used to examine your colon directly (sigmoidoscopy or colonoscopy). This is done to check for the earliest forms of colorectal  cancer.  Routine screening usually begins at age 50.  Direct examination of the colon should be repeated every 5-10 years through 56 years of age. However, you may need to be screened more often if early forms of precancerous polyps or small growths are found. Skin Cancer  Check your skin from head to toe regularly.  Tell your health care provider about any new moles or changes in moles, especially if there is a change in a mole's shape or color.  Also tell your health care provider if you have a mole that is larger than the size of a pencil eraser.  Always use sunscreen. Apply sunscreen liberally and repeatedly throughout the day.  Protect yourself by wearing long sleeves, pants, a wide-brimmed hat, and sunglasses whenever you are outside. HEART DISEASE, DIABETES, AND HIGH BLOOD PRESSURE   High blood pressure causes heart disease and increases the risk of stroke. High blood pressure is more likely to develop in:  People who have blood pressure in the high end   of the normal range (130-139/85-89 mm Hg).  People who are overweight or obese.  People who are African American.  If you are 8-4 years of age, have your blood pressure checked every 3-5 years. If you are 77 years of age or older, have your blood pressure checked every year. You should have your blood pressure measured twice--once when you are at a hospital or clinic, and once when you are not at a hospital or clinic. Record the average of the two measurements. To check your blood pressure when you are not at a hospital or clinic, you can use:  An automated blood pressure machine at a pharmacy.  A home blood pressure monitor.  If you are between 36 years and 64 years old, ask your health care provider if you should take aspirin to prevent strokes.  Have regular diabetes screenings. This involves taking a blood sample to check your fasting blood sugar level.  If you are at a normal weight and have a low risk for diabetes,  have this test once every three years after 56 years of age.  If you are overweight and have a high risk for diabetes, consider being tested at a younger age or more often. PREVENTING INFECTION  Hepatitis B  If you have a higher risk for hepatitis B, you should be screened for this virus. You are considered at high risk for hepatitis B if:  You were born in a country where hepatitis B is common. Ask your health care provider which countries are considered high risk.  Your parents were born in a high-risk country, and you have not been immunized against hepatitis B (hepatitis B vaccine).  You have HIV or AIDS.  You use needles to inject street drugs.  You live with someone who has hepatitis B.  You have had sex with someone who has hepatitis B.  You get hemodialysis treatment.  You take certain medicines for conditions, including cancer, organ transplantation, and autoimmune conditions. Hepatitis C  Blood testing is recommended for:  Everyone born from [birth year] through 1965.  Anyone with known risk factors for hepatitis C. Sexually transmitted infections (STIs)  You should be screened for sexually transmitted infections (STIs) including gonorrhea and chlamydia if:  You are sexually active and are younger than 56 years of age.  You are older than 56 years of age and your health care provider tells you that you are at risk for this type of infection.  Your sexual activity has changed since you were last screened and you are at an increased risk for chlamydia or gonorrhea. Ask your health care provider if you are at risk.  If you do not have HIV, but are at risk, it may be recommended that you take a prescription medicine daily to prevent HIV infection. This is called pre-exposure prophylaxis (PrEP). You are considered at risk if:  You are sexually active and do not regularly use condoms or know the HIV status of your partner(s).  You take drugs by injection.  You are sexually  active with a partner who has HIV. Talk with your health care provider about whether you are at high risk of being infected with HIV. If you choose to begin PrEP, you should first be tested for HIV. You should then be tested every 3 months for as long as you are taking PrEP.  PREGNANCY   If you are premenopausal and you may become pregnant, ask your health care provider about preconception counseling.  If you may  become pregnant, take 400 to 800 micrograms (mcg) of folic acid every day.  If you want to prevent pregnancy, talk to your health care provider about birth control (contraception). OSTEOPOROSIS AND MENOPAUSE   Osteoporosis is a disease in which the bones lose minerals and strength with aging. This can result in serious bone fractures. Your risk for osteoporosis can be identified using a bone density scan.  If you are 61 years of age or older, or if you are at risk for osteoporosis and fractures, ask your health care provider if you should be screened.  Ask your health care provider whether you should take a calcium or vitamin D supplement to lower your risk for osteoporosis.  Menopause may have certain physical symptoms and risks.  Hormone replacement therapy may reduce some of these symptoms and risks. Talk to your health care provider about whether hormone replacement therapy is right for you.  HOME CARE INSTRUCTIONS   Schedule regular health, dental, and eye exams.  Stay current with your immunizations.   Do not use any tobacco products including cigarettes, chewing tobacco, or electronic cigarettes.  If you are pregnant, do not drink alcohol.  If you are breastfeeding, limit how much and how often you drink alcohol.  Limit alcohol intake to no more than 1 drink per day for nonpregnant women. One drink equals 12 ounces of beer, 5 ounces of wine, or 1 ounces of hard liquor.  Do not use street drugs.  Do not share needles.  Ask your health care provider for help if  you need support or information about quitting drugs.  Tell your health care provider if you often feel depressed.  Tell your health care provider if you have ever been abused or do not feel safe at home.   This information is not intended to replace advice given to you by your health care provider. Make sure you discuss any questions you have with your health care provider.   Document Released: 12/15/2010 Document Revised: 06/22/2014 Document Reviewed: 05/03/2013 Elsevier Interactive Patient Education [employer].

## 2015-09-11 LAB — ANEMIA PROFILE B
BASOS ABS: 0 10*3/uL (ref 0.0–0.2)
Basos: 0 %
EOS (ABSOLUTE): 0.1 10*3/uL (ref 0.0–0.4)
Eos: 2 %
Ferritin: 21 ng/mL (ref 15–150)
Hematocrit: 44.1 % (ref 34.0–46.6)
Hemoglobin: 14.8 g/dL (ref 11.1–15.9)
IMMATURE GRANS (ABS): 0 10*3/uL (ref 0.0–0.1)
IMMATURE GRANULOCYTES: 0 %
IRON SATURATION: 17 % (ref 15–55)
IRON: 55 ug/dL (ref 27–159)
LYMPHS: 29 %
Lymphocytes Absolute: 2.6 10*3/uL (ref 0.7–3.1)
MCH: 31.4 pg (ref 26.6–33.0)
MCHC: 33.6 g/dL (ref 31.5–35.7)
MCV: 93 fL (ref 79–97)
Monocytes Absolute: 0.8 10*3/uL (ref 0.1–0.9)
Monocytes: 9 %
NEUTROS PCT: 60 %
Neutrophils Absolute: 5.4 10*3/uL (ref 1.4–7.0)
PLATELETS: 321 10*3/uL (ref 150–379)
RBC: 4.72 x10E6/uL (ref 3.77–5.28)
RDW: 12.9 % (ref 12.3–15.4)
Retic Ct Pct: 1.2 % (ref 0.6–2.6)
Total Iron Binding Capacity: 321 ug/dL (ref 250–450)
UIBC: 266 ug/dL (ref 131–425)
Vitamin B-12: 1964 pg/mL — ABNORMAL HIGH (ref 211–946)
WBC: 9 10*3/uL (ref 3.4–10.8)

## 2015-09-11 LAB — THYROID PANEL WITH TSH
Free Thyroxine Index: 1.8 (ref 1.2–4.9)
T3 UPTAKE RATIO: 21 % — AB (ref 24–39)
T4 TOTAL: 8.8 ug/dL (ref 4.5–12.0)
TSH: 3.05 u[IU]/mL (ref 0.450–4.500)

## 2015-09-11 LAB — CMP14+EGFR
ALK PHOS: 123 IU/L — AB (ref 39–117)
ALT: 22 IU/L (ref 0–32)
AST: 18 IU/L (ref 0–40)
Albumin/Globulin Ratio: 1.4 (ref 1.2–2.2)
Albumin: 3.7 g/dL (ref 3.5–5.5)
BUN/Creatinine Ratio: 23 (ref 9–23)
BUN: 14 mg/dL (ref 6–24)
CHLORIDE: 101 mmol/L (ref 96–106)
CO2: 24 mmol/L (ref 18–29)
Calcium: 8.3 mg/dL — ABNORMAL LOW (ref 8.7–10.2)
Creatinine, Ser: 0.6 mg/dL (ref 0.57–1.00)
GFR calc Af Amer: 119 mL/min/{1.73_m2} (ref >59)
GFR calc non Af Amer: 103 mL/min/{1.73_m2} (ref >59)
GLUCOSE: 96 mg/dL (ref 65–99)
Globulin, Total: 2.7 g/dL (ref 1.5–4.5)
Potassium: 3.7 mmol/L (ref 3.5–5.2)
SODIUM: 144 mmol/L (ref 134–144)
Total Protein: 6.4 g/dL (ref 6.0–8.5)

## 2015-09-11 LAB — VITAMIN D 25 HYDROXY (VIT D DEFICIENCY, FRACTURES): VIT D 25 HYDROXY: 29 ng/mL — AB (ref 30.0–100.0)

## 2015-09-11 LAB — LIPID PANEL
CHOLESTEROL TOTAL: 138 mg/dL (ref 100–199)
Chol/HDL Ratio: 4.8 ratio units — ABNORMAL HIGH (ref 0.0–4.4)
HDL: 29 mg/dL — ABNORMAL LOW (ref >39)
LDL Calculated: 83 mg/dL (ref 0–99)
Triglycerides: 129 mg/dL (ref 0–149)
VLDL CHOLESTEROL CAL: 26 mg/dL (ref 5–40)

## 2015-09-11 LAB — HEPATITIS C ANTIBODY: Hep C Virus Ab: 0.1 s/co ratio (ref 0.0–0.9)

## 2015-09-16 LAB — PAP IG W/ RFLX HPV ASCU: PAP SMEAR COMMENT: 0

## 2015-09-17 ENCOUNTER — Telehealth: Payer: Self-pay | Admitting: Nurse Practitioner

## 2015-09-18 DIAGNOSIS — H2513 Age-related nuclear cataract, bilateral: Secondary | ICD-10-CM | POA: Diagnosis not present

## 2015-09-18 DIAGNOSIS — H10413 Chronic giant papillary conjunctivitis, bilateral: Secondary | ICD-10-CM | POA: Diagnosis not present

## 2015-09-18 DIAGNOSIS — H43811 Vitreous degeneration, right eye: Secondary | ICD-10-CM | POA: Diagnosis not present

## 2015-09-18 DIAGNOSIS — H43391 Other vitreous opacities, right eye: Secondary | ICD-10-CM | POA: Diagnosis not present

## 2015-09-20 ENCOUNTER — Ambulatory Visit (INDEPENDENT_AMBULATORY_CARE_PROVIDER_SITE_OTHER): Payer: BLUE CROSS/BLUE SHIELD | Admitting: Pharmacist

## 2015-09-20 ENCOUNTER — Encounter: Payer: Self-pay | Admitting: Pharmacist

## 2015-09-20 DIAGNOSIS — M810 Age-related osteoporosis without current pathological fracture: Secondary | ICD-10-CM | POA: Diagnosis not present

## 2015-09-20 MED ORDER — SOD FLUORIDE-CA CARBONATE 8.3-364 MG PO TABS: 1.0000 | ORAL_TABLET | Freq: Every day | ORAL | Status: DC

## 2015-09-20 MED ORDER — VITAMIN D 1000 UNITS PO TABS: 2000.0000 [IU] | ORAL_TABLET | Freq: Every day | ORAL | Status: DC

## 2015-09-20 NOTE — Patient Instructions (Signed)
Fall Prevention in the Home  Falls can cause injuries and can affect people from all age groups. There are many simple things that you can do to make your home safe and to help prevent falls. WHAT CAN I DO ON THE OUTSIDE OF MY HOME?  Regularly repair the edges of walkways and driveways and fix any cracks.  Remove high doorway thresholds.  Trim any shrubbery on the main path into your home.  Use bright outdoor lighting.  Clear walkways of debris and clutter, including tools and rocks.  Regularly check that handrails are securely fastened and in good repair. Both sides of any steps should have handrails.  Install guardrails along the edges of any raised decks or porches.  Have leaves, snow, and ice cleared regularly.  Use sand or salt on walkways during winter months.  In the garage, clean up any spills right away, including grease or oil spills. WHAT CAN I DO IN THE BATHROOM?  Use night lights.  Install grab bars by the toilet and in the tub and shower. Do not use towel bars as grab bars.  Use non-skid mats or decals on the floor of the tub or shower.  If you need to sit down while you are in the shower, use a plastic, non-slip stool..  Keep the floor dry. Immediately clean up any water that spills on the floor.  Remove soap buildup in the tub or shower on a regular basis.  Attach bath mats securely with double-sided non-slip rug tape.  Remove throw rugs and other tripping hazards from the floor. WHAT CAN I DO IN THE BEDROOM?  Use night lights.  Make sure that a bedside light is easy to reach.  Do not use oversized bedding that drapes onto the floor.  Have a firm chair that has side arms to use for getting dressed.  Remove throw rugs and other tripping hazards from the floor. WHAT CAN I DO IN THE KITCHEN?   Clean up any spills right away.  Avoid walking on wet floors.  Place frequently used items in easy-to-reach places.  If you need to reach for something  above you, use a sturdy step stool that has a grab bar.  Keep electrical cables out of the way.  Do not use floor polish or wax that makes floors slippery. If you have to use wax, make sure that it is non-skid floor wax.  Remove throw rugs and other tripping hazards from the floor. WHAT CAN I DO IN THE STAIRWAYS?  Do not leave any items on the stairs.  Make sure that there are handrails on both sides of the stairs. Fix handrails that are broken or loose. Make sure that handrails are as long as the stairways.  Check any carpeting to make sure that it is firmly attached to the stairs. Fix any carpet that is loose or worn.  Avoid having throw rugs at the top or bottom of stairways, or secure the rugs with carpet tape to prevent them from moving.  Make sure that you have a light switch at the top of the stairs and the bottom of the stairs. If you do not have them, have them installed. WHAT ARE SOME OTHER FALL PREVENTION TIPS?  Wear closed-toe shoes that fit well and support your feet. Wear shoes that have rubber soles or low heels.  When you use a stepladder, make sure that it is completely opened and that the sides are firmly locked. Have someone hold the ladder while you   are using it. Do not climb a closed stepladder.  Add color or contrast paint or tape to grab bars and handrails in your home. Place contrasting color strips on the first and last steps.  Use mobility aids as needed, such as canes, walkers, scooters, and crutches.  Turn on lights if it is dark. Replace any light bulbs that burn out.  Set up furniture so that there are clear paths. Keep the furniture in the same spot.  Fix any uneven floor surfaces.  Choose a carpet design that does not hide the edge of steps of a stairway.  Be aware of any and all pets.  Review your medicines with your healthcare provider. Some medicines can cause dizziness or changes in blood pressure, which increase your risk of falling. Talk  with your health care provider about other ways that you can decrease your risk of falls. This may include working with a physical therapist or trainer to improve your strength, balance, and endurance.   This information is not intended to replace advice given to you by your health care provider. Make sure you discuss any questions you have with your health care provider.   Document Released: 05/22/2002 Document Revised: 10/16/2014 Document Reviewed: 07/06/2014 Elsevier Interactive Patient Education 2016 Elsevier Inc.  

## 2015-09-20 NOTE — Progress Notes (Signed)
Patient ID: Maria [name], female   DOB: 1960/12/16, 55 y.o.   MRN: [id number]  [facility]  HPI: Does pt already have a diagnosis of:  Osteopenia?  Yes Osteoporosis?  No  Back Pain?  No       Kyphosis?  No Prior fracture?  No Med(s) for Osteoporosis/Osteopenia:  none Med(s) previously tried for Osteoporosis/Osteopenia:  none                                                             PMH: Hysterectomy?  No Oophorectomy?  No HRT? No Steroid Use?  No Thyroid med?  No History of cancer?  No History of digestive disorders (ie Crohn's)?  No Current or previous eating disorders?  No Last Vitamin D Result:  29.0 (08/2015) Last GFR Result:  103 (08/2015) Calcium was low at 8.3 (08/2015) Also alkaline phosphatase was elevated at 128 (08/2015)   FH/SH: Family history of osteoporosis?  Yes - sister Parent with history of hip fracture?  No Family history of breast cancer?  Yes - paternal aunt Exercise?  No Smoking?  No Alcohol?  No    Calcium Assessment Calcium Intake  # of servings/day  Calcium mg  Milk (8 oz) 0  x  300  = 0  Yogurt (4 oz) 0 x  200 = 0  Cheese (1 oz) 0 x  200 = 0  Other Calcium sources   283m  Ca supplement florical qd = 1472m  Estimated calcium intake per day 39555m  DEXA Results Date of Test T-Score for AP Spine L1-L4 T-Score for Total Left Hip T-Score for Total Right Hip  09/10/2015 -0.7 -2.7 -2.6  10/01/2010 -0.3 -2.3 -2.3  08/15/2008 0.3 -1.8 -1.8  08/10/2005 0.8 -1.9 -1.7   Assessment: Continued decreased in BMD which is now in osteoporosis/ high fracture risk category. Low serum calcium Elevated alk phos  Recommendations: 1.  Discussed treatment option - will start alendronate 83m72mweek after lab return - patient RTC next week for PTH with calcium and isoenzyme alk phos. 2.  recommend calcium 1200mg76mly through supplementation or diet.  3.  recommend weight bearing exercise - 30 minutes at least 4 days per week.   4.  Counseled  and educated about fall risk and prevention.  Recheck DEXA:  2 years  Time spent counseling patient:  30 minutes

## 2015-09-23 ENCOUNTER — Other Ambulatory Visit: Payer: BLUE CROSS/BLUE SHIELD

## 2015-09-23 DIAGNOSIS — M810 Age-related osteoporosis without current pathological fracture: Secondary | ICD-10-CM

## 2015-09-23 NOTE — Telephone Encounter (Signed)
Wellness faxed

## 2015-09-24 LAB — PTH, INTACT AND CALCIUM
Calcium: 8.6 mg/dL — ABNORMAL LOW (ref 8.7–10.2)
PTH: 46 pg/mL (ref 15–65)

## 2015-09-24 LAB — ALKALINE PHOSPHATASE, ISOENZYMES
ALK PHOS: 130 IU/L — AB (ref 39–117)
BONE FRACTION: 45 % (ref 14–68)
INTESTINAL FRAC.: 0 % (ref 0–18)
LIVER FRACTION: 55 % (ref 18–85)

## 2015-10-07 ENCOUNTER — Telehealth: Payer: Self-pay | Admitting: Pharmacist

## 2015-10-07 NOTE — Telephone Encounter (Signed)
Left message about lab results.  Recommend since calcium in improved that patient continue vitamin D, start alendronate 32m 1 tablet weekly.  Recheck BMET and LFTs in 1 month. Left message for patient to call and discuss.

## 2015-10-10 MED ORDER — ALENDRONATE SODIUM 70 MG PO TABS: 70.0000 mg | ORAL_TABLET | ORAL | Status: DC

## 2015-10-10 NOTE — Telephone Encounter (Signed)
Patient notified of labs and Rx

## 2015-10-11 ENCOUNTER — Other Ambulatory Visit: Payer: BLUE CROSS/BLUE SHIELD

## 2015-10-11 DIAGNOSIS — Z1212 Encounter for screening for malignant neoplasm of rectum: Secondary | ICD-10-CM

## 2015-10-16 ENCOUNTER — Encounter: Payer: Self-pay | Admitting: Nurse Practitioner

## 2015-10-16 LAB — FECAL OCCULT BLOOD, IMMUNOCHEMICAL: FECAL OCCULT BLD: NEGATIVE

## 2015-10-28 ENCOUNTER — Encounter: Payer: Self-pay | Admitting: Physician Assistant

## 2015-10-28 ENCOUNTER — Ambulatory Visit (INDEPENDENT_AMBULATORY_CARE_PROVIDER_SITE_OTHER): Payer: BLUE CROSS/BLUE SHIELD | Admitting: Physician Assistant

## 2015-10-28 VITALS — BP 101/64 | HR 82 | Temp 98.3°F | Ht 67.0 in | Wt 175.0 lb

## 2015-10-28 DIAGNOSIS — M25561 Pain in right knee: Secondary | ICD-10-CM

## 2015-10-28 MED ORDER — MELOXICAM 15 MG PO TABS: 15.0000 mg | ORAL_TABLET | Freq: Every day | ORAL | Status: DC

## 2015-10-28 NOTE — Patient Instructions (Signed)

## 2015-10-28 NOTE — Progress Notes (Signed)
Subjective:     Patient ID: Maria [name], female   DOB: March 17, 1961, 55 y.o.   MRN: [id number]  HPI R knee pain for several weeks Hx of same prev and steroid pack improved Sx worse going down hills and steps Worse on concrete floors No hx of injury  Review of Systems + pain and swelling to the R knee No locking or giving way of the knee No radiation of sx     Objective:   Physical Exam Sl edema/effusion noted to the medial knee FROM of the knee sl patellar crepitus noted No lateral laxity + sx with patellar compression Lachman/McMurray neg Good strength distal Good sensory    Assessment:     R knee pain    Plan:     Pt tried OTC NSAIDS last night with some improvement so will try Mobic 23m q d x 1 week then prn Hold OTC NSAIDS Heat/Ice F/U prn

## 2015-11-15 DIAGNOSIS — D3132 Benign neoplasm of left choroid: Secondary | ICD-10-CM | POA: Diagnosis not present

## 2015-11-15 DIAGNOSIS — H43392 Other vitreous opacities, left eye: Secondary | ICD-10-CM | POA: Diagnosis not present

## 2015-11-15 DIAGNOSIS — H43811 Vitreous degeneration, right eye: Secondary | ICD-10-CM | POA: Diagnosis not present

## 2015-11-15 DIAGNOSIS — H43391 Other vitreous opacities, right eye: Secondary | ICD-10-CM | POA: Diagnosis not present

## 2015-12-24 ENCOUNTER — Other Ambulatory Visit: Payer: Self-pay | Admitting: Pharmacist

## 2015-12-24 MED ORDER — SOD FLUORIDE-CA CARBONATE 8.3-364 MG PO TABS: 1.0000 | ORAL_TABLET | Freq: Every day | ORAL | Status: DC

## 2016-01-23 DIAGNOSIS — H43811 Vitreous degeneration, right eye: Secondary | ICD-10-CM | POA: Diagnosis not present

## 2016-01-23 DIAGNOSIS — H43392 Other vitreous opacities, left eye: Secondary | ICD-10-CM | POA: Diagnosis not present

## 2016-01-23 DIAGNOSIS — D3132 Benign neoplasm of left choroid: Secondary | ICD-10-CM | POA: Diagnosis not present

## 2016-01-23 DIAGNOSIS — H43391 Other vitreous opacities, right eye: Secondary | ICD-10-CM | POA: Diagnosis not present

## 2016-02-25 ENCOUNTER — Telehealth: Payer: Self-pay | Admitting: Nurse Practitioner

## 2016-02-25 ENCOUNTER — Other Ambulatory Visit: Payer: Self-pay

## 2016-02-25 MED ORDER — ALENDRONATE SODIUM 70 MG PO TABS: 70.0000 mg | ORAL_TABLET | ORAL | 0 refills | Status: DC

## 2016-02-25 NOTE — Telephone Encounter (Signed)
done

## 2016-03-18 DIAGNOSIS — H5213 Myopia, bilateral: Secondary | ICD-10-CM | POA: Diagnosis not present

## 2016-04-17 ENCOUNTER — Encounter: Payer: Self-pay | Admitting: Nurse Practitioner

## 2016-04-17 ENCOUNTER — Ambulatory Visit (INDEPENDENT_AMBULATORY_CARE_PROVIDER_SITE_OTHER): Payer: BLUE CROSS/BLUE SHIELD | Admitting: Nurse Practitioner

## 2016-04-17 VITALS — BP 105/64 | HR 86 | Temp 98.1°F | Ht 67.0 in | Wt 169.0 lb

## 2016-04-17 DIAGNOSIS — R42 Dizziness and giddiness: Secondary | ICD-10-CM

## 2016-04-17 MED ORDER — PREDNISONE 10 MG (21) PO TBPK: ORAL_TABLET | ORAL | 0 refills | Status: DC

## 2016-04-17 MED ORDER — MECLIZINE HCL 25 MG PO TABS: 25.0000 mg | ORAL_TABLET | Freq: Three times a day (TID) | ORAL | 1 refills | Status: DC | PRN

## 2016-04-17 NOTE — Progress Notes (Signed)
   Subjective:    Patient ID: Maria [name], female    DOB: [birth year], 55 y.o.   MRN: [id number]  HPIPatient in c/o verigo- she has had in the past- Started this time about 2 weeks ago but is occurring more frequently. Cant even rool ove rin the bed at night without getting dizzy.    Review of Systems  Constitutional: Negative.   HENT: Negative.   Respiratory: Negative.   Cardiovascular: Negative.   Genitourinary: Negative.   Neurological: Negative.   Psychiatric/Behavioral: Negative.   All other systems reviewed and are negative.      Objective:   Physical Exam  Constitutional: She is oriented to person, place, and time. She appears well-developed and well-nourished. No distress.  HENT:  Right Ear: External ear normal.  Left Ear: External ear normal.  Cardiovascular: Normal rate, regular rhythm and normal heart sounds.   Pulmonary/Chest: Effort normal and breath sounds normal.  Neurological: She is alert and oriented to person, place, and time. She has normal reflexes. No cranial nerve deficit.  Skin: Skin is warm.  Psychiatric: She has a normal mood and affect. Her behavior is normal. Judgment and thought content normal.   BP 105/64   Pulse 86   Temp 98.1 F (36.7 C) (Oral)   Ht 5' 7"  (1.702 m)   Wt 169 lb (76.7 kg)   BMI 26.47 kg/m        Assessment & Plan:  1. Vertigo Force fluids RTO prn - predniSONE (STERAPRED UNI-PAK 21 TAB) 10 MG (21) TBPK tablet; As directed x 6 days  Dispense: 21 tablet; Refill: 0 - meclizine (ANTIVERT) 25 MG tablet; Take 1 tablet (25 mg total) by mouth 3 (three) times daily as needed for dizziness.  Dispense: 30 tablet; Refill: [place], FNP

## 2016-04-17 NOTE — Patient Instructions (Signed)
Vertigo Vertigo means that you feel like you are moving when you are not. Vertigo can also make you feel like things around you are moving when they are not. This feeling can come and go at any time. Vertigo often goes away on its own. HOME CARE  Avoid making fast movements.  Avoid driving.  Avoid using heavy machinery.  Avoid doing any task or activity that might cause danger to you or other people if you would have a vertigo attack while you are doing it.  Sit down right away if you feel dizzy or have trouble with your balance.  Take over-the-counter and prescription medicines only as told by your doctor.  Follow instructions from your doctor about which positions or movements you should avoid.  Drink enough fluid to keep your pee (urine) clear or pale yellow.  Keep all follow-up visits as told by your doctor. This is important. GET HELP IF:  Medicine does not help your vertigo.  You have a fever.  Your problems get worse or you have new symptoms.  Your family or friends see changes in your behavior.  You feel sick to your stomach (nauseous) or you throw up (vomit).  You have a "pins and needles" feeling or you are numb in part of your body. GET HELP RIGHT AWAY IF:  You have trouble moving or talking.  You are always dizzy.  You pass out (faint).  You get very bad headaches.  You feel weak or have trouble using your hands, arms, or legs.  You have changes in your hearing.  You have changes in your seeing (vision).  You get a stiff neck.  Bright light starts to bother you.   This information is not intended to replace advice given to you by your health care provider. Make sure you discuss any questions you have with your health care provider.   Document Released: 03/10/2008 Document Revised: 02/20/2015 Document Reviewed: 09/24/2014 Elsevier Interactive Patient Education [employer].

## 2016-08-03 ENCOUNTER — Encounter: Payer: Self-pay | Admitting: Nurse Practitioner

## 2016-08-03 ENCOUNTER — Other Ambulatory Visit: Payer: Self-pay | Admitting: Nurse Practitioner

## 2016-08-03 DIAGNOSIS — R42 Dizziness and giddiness: Secondary | ICD-10-CM

## 2016-08-03 MED ORDER — MECLIZINE HCL 25 MG PO TABS: 25.0000 mg | ORAL_TABLET | Freq: Three times a day (TID) | ORAL | 1 refills | Status: DC | PRN

## 2016-08-13 ENCOUNTER — Other Ambulatory Visit: Payer: Self-pay | Admitting: Nurse Practitioner

## 2016-08-13 DIAGNOSIS — Z1231 Encounter for screening mammogram for malignant neoplasm of breast: Secondary | ICD-10-CM

## 2016-09-01 ENCOUNTER — Ambulatory Visit
Admission: RE | Admit: 2016-09-01 | Discharge: 2016-09-01 | Disposition: A | Discharge status: Home / Self Care | Payer: BLUE CROSS/BLUE SHIELD | Source: Ambulatory Visit | Attending: Nurse Practitioner | Admitting: Nurse Practitioner

## 2016-09-01 DIAGNOSIS — Z1231 Encounter for screening mammogram for malignant neoplasm of breast: Secondary | ICD-10-CM | POA: Diagnosis not present

## 2016-09-23 ENCOUNTER — Ambulatory Visit (INDEPENDENT_AMBULATORY_CARE_PROVIDER_SITE_OTHER): Payer: BLUE CROSS/BLUE SHIELD | Admitting: Pediatrics

## 2016-09-23 VITALS — BP 127/79 | HR 80 | Temp 98.9°F | Ht 67.0 in | Wt 165.8 lb

## 2016-09-23 DIAGNOSIS — J40 Bronchitis, not specified as acute or chronic: Secondary | ICD-10-CM

## 2016-09-23 DIAGNOSIS — J069 Acute upper respiratory infection, unspecified: Secondary | ICD-10-CM

## 2016-09-23 MED ORDER — FLUTICASONE PROPIONATE 50 MCG/ACT NA SUSP: 2.0000 | Freq: Every day | NASAL | 6 refills | Status: DC

## 2016-09-23 MED ORDER — CETIRIZINE HCL 10 MG PO TABS: 10.0000 mg | ORAL_TABLET | Freq: Every day | ORAL | 11 refills | Status: DC

## 2016-09-23 MED ORDER — AZITHROMYCIN 250 MG PO TABS: ORAL_TABLET | ORAL | 0 refills | Status: DC

## 2016-09-23 NOTE — Progress Notes (Signed)
  Subjective:   Patient ID: Maria [name], female    DOB: [birth year], 56 y.o.   MRN: [id number] CC: Cough and URI  HPI: Maria [name] is a 56 y.o. female presenting for Cough and URI  Started getting sick about a week ago Started with sneezing, now coughing, lots of sinus drainage Taking mucinex, benadryl Feeling lots of drainage from sinuses, blowing a lot of green discharge out from nose Energy level is gone, appetite is down Says she feels terrible Sleeping fine, only thing she feels like doing  Relevant past medical, surgical, family and social history reviewed. Allergies and medications reviewed and updated. History  Smoking Status  . Never Smoker  Smokeless Tobacco  . Never Used   ROS: Per HPI   Objective:    BP 127/79   Pulse 80   Temp 98.9 F (37.2 C) (Oral)   Ht 5' 7"  (1.702 m)   Wt 165 lb 12.8 oz (75.2 kg)   BMI 25.97 kg/m   Wt Readings from Last 3 Encounters:  09/23/16 165 lb 12.8 oz (75.2 kg)  04/17/16 169 lb (76.7 kg)  10/28/15 175 lb (79.4 kg)    Gen: NAD, alert, cooperative with exam, NCAT EYES: EOMI, no conjunctival injection, or no icterus ENT:  TMs pearly gray b/l, OP with mild erythema LYMPH: no cervical LAD CV: NRRR, normal S1/S2, no murmur, distal pulses 2+ b/l Resp: CTABL, no wheezes, normal WOB Ext: No edema, warm Neuro: Alert and oriented  Assessment & Plan:  Maria [name] was seen today for cough and uri.  Diagnoses and all orders for this visit:  Acute URI Discussed symptom care If not improving start abx as below Discussed return precautions -     cetirizine (ZYRTEC) 10 MG tablet; Take 1 tablet (10 mg total) by mouth daily. -     fluticasone (FLONASE) 50 MCG/ACT nasal spray; Place 2 sprays into both nostrils daily.  Bronchitis -     azithromycin (ZITHROMAX) 250 MG tablet; Take 2 the first day and then one each day after.   Follow up plan: As needed Assunta Found, MD [place]

## 2016-09-23 NOTE — Patient Instructions (Addendum)
Netipot with distilled water 2-3 times a day to clear out sinuses Or Normal saline nasal spray Flonase steroid nasal spray Antihistamine daily such as cetirizine Ibuprofen 670m three times a day Lots of fluids  Start antibiotic if feeling worse over the next 2-3 days

## 2016-10-23 ENCOUNTER — Other Ambulatory Visit: Payer: Self-pay | Admitting: Family

## 2017-02-08 ENCOUNTER — Other Ambulatory Visit: Payer: Self-pay | Admitting: Family

## 2017-02-11 ENCOUNTER — Ambulatory Visit (INDEPENDENT_AMBULATORY_CARE_PROVIDER_SITE_OTHER): Payer: BLUE CROSS/BLUE SHIELD | Admitting: Family Medicine

## 2017-02-11 ENCOUNTER — Encounter: Payer: Self-pay | Admitting: Family Medicine

## 2017-02-11 VITALS — BP 124/84 | HR 86 | Temp 97.3°F | Ht 67.0 in | Wt 164.6 lb

## 2017-02-11 DIAGNOSIS — M545 Low back pain, unspecified: Secondary | ICD-10-CM

## 2017-02-11 MED ORDER — TRIAMCINOLONE ACETONIDE 40 MG/ML IJ SUSP
40.0000 mg | Freq: Once | INTRAMUSCULAR | Status: AC
  Administered 2017-02-11: 40 mg via INTRAMUSCULAR

## 2017-02-11 NOTE — Patient Instructions (Signed)
Great to meet you!  Come back with any concerns or failure to improve.   Look over the exercises and try to incorporate some of them into your routine.

## 2017-02-11 NOTE — Progress Notes (Signed)
   HPI  Patient presents today here with right hip pain  Patient describes posterior right hip pain in the right lower back for about 2-4 months. She states over the last week or so it's been getting worse. She states the worst episodes when she wakes up in the note of the night with aching severe right hip pain. She denies any direct injury.  She has tried 400 mg ibuprofen and Flexeril without much improvement.  Patient states that she has difficulty tolerating pills and would like an injection if possible  PMH: Smoking status noted ROS: Per HPI  Objective: BP 124/84   Pulse 86   Temp (!) 97.3 F (36.3 C) (Oral)   Ht 5' 7"  (1.702 m)   Wt 164 lb 9.6 oz (74.7 kg)   BMI 25.78 kg/m  Gen: NAD, alert, cooperative with exam HEENT: NCAT CV: RRR, good S1/S2, no murmur Resp: CTABL, no wheezes, non-labored Ext: No edema, warm Neuro: Alert and oriented, strength 5/5 and sensation intact in bilateral lower extremities MSK No tenderness to palpation of her right greater trochanter, right paraspinal muscles, midline spine, Negative Faber and fadir  test  Assessment and plan:  # Right low back pain Most likely SI joint dysfunction Given IM Kenalog injection in the clinic Also given handout from sports medicine patient advisor for rehabilitation exercises    Meds ordered this encounter  Medications  . triamcinolone acetonide (KENALOG-40) injection 40 mg    Laroy Apple, MD [facility] 02/11/2017, 7:02 PM

## 2017-02-16 ENCOUNTER — Other Ambulatory Visit: Payer: Self-pay | Admitting: Nurse Practitioner

## 2017-02-16 DIAGNOSIS — I10 Essential (primary) hypertension: Secondary | ICD-10-CM

## 2017-03-30 ENCOUNTER — Encounter: Payer: Self-pay | Admitting: Nurse Practitioner

## 2017-03-30 ENCOUNTER — Ambulatory Visit (INDEPENDENT_AMBULATORY_CARE_PROVIDER_SITE_OTHER): Payer: BLUE CROSS/BLUE SHIELD | Admitting: Nurse Practitioner

## 2017-03-30 VITALS — BP 102/63 | HR 72 | Temp 97.4°F | Ht 67.0 in | Wt 163.0 lb

## 2017-03-30 DIAGNOSIS — K219 Gastro-esophageal reflux disease without esophagitis: Secondary | ICD-10-CM

## 2017-03-30 DIAGNOSIS — M25551 Pain in right hip: Secondary | ICD-10-CM

## 2017-03-30 DIAGNOSIS — Z23 Encounter for immunization: Secondary | ICD-10-CM

## 2017-03-30 DIAGNOSIS — Z0000 Encounter for general adult medical examination without abnormal findings: Secondary | ICD-10-CM

## 2017-03-30 DIAGNOSIS — E785 Hyperlipidemia, unspecified: Secondary | ICD-10-CM | POA: Diagnosis not present

## 2017-03-30 DIAGNOSIS — M810 Age-related osteoporosis without current pathological fracture: Secondary | ICD-10-CM

## 2017-03-30 DIAGNOSIS — Z6827 Body mass index (BMI) 27.0-27.9, adult: Secondary | ICD-10-CM

## 2017-03-30 DIAGNOSIS — I10 Essential (primary) hypertension: Secondary | ICD-10-CM

## 2017-03-30 MED ORDER — TRIAMTERENE-HCTZ 75-50 MG PO TABS: 1.0000 | ORAL_TABLET | Freq: Every day | ORAL | 1 refills | Status: DC

## 2017-03-30 MED ORDER — ALENDRONATE SODIUM 70 MG PO TABS: ORAL_TABLET | ORAL | 1 refills | Status: DC

## 2017-03-30 MED ORDER — DICLOFENAC SODIUM 75 MG PO TBEC: 75.0000 mg | DELAYED_RELEASE_TABLET | Freq: Two times a day (BID) | ORAL | 3 refills | Status: DC

## 2017-03-30 MED ORDER — PHENTERMINE HCL 37.5 MG PO TABS: 37.5000 mg | ORAL_TABLET | Freq: Every day | ORAL | 1 refills | Status: DC

## 2017-03-30 NOTE — Progress Notes (Addendum)
Subjective:    Patient ID: Maria [name], female    DOB: 10/02/1960, 56 y.o.   MRN: [id number]  HPI Maria [name] is here today for follow up of chronic medical problem.  Outpatient Encounter Prescriptions as of 03/30/2017  Medication Sig  . alendronate (FOSAMAX) 70 MG tablet TAKE ONE TABLET BY MOUTH ONCE A WEEK. TAKE WITH A FULL GLASS OF WATER ON AN EMPTY STOMACH  . cholecalciferol (VITAMIN D) 1000 units tablet Take 2 tablets (2,000 Units total) by mouth daily.  . Cyanocobalamin (VITAMIN B 12 PO) Take by mouth.  . meclizine (ANTIVERT) 25 MG tablet Take 1 tablet (25 mg total) by mouth 3 (three) times daily as needed for dizziness. (Patient not taking: Reported on 02/11/2017)  . Sod Fluoride-Ca Carbonate 8.3-364 MG TABS Take 1 tablet by mouth daily.  Marland Kitchen triamterene-hydrochlorothiazide (MAXZIDE) 75-50 MG tablet TAKE ONE TABLET BY MOUTH ONCE DAILY   No facility-administered encounter medications on file as of 03/30/2017.     1. Essential hypertension  Blood pressure controlled with Maxzide daily.  Patient does not check blood pressure at home.  2. Gastroesophageal reflux disease without esophagitis  Patient managing symptoms with OTC medications.  3. Osteoporosis without current pathological fracture, unspecified osteoporosis type  Patient taking Fosamax once weekly.  No current fracture.  4. Hyperlipidemia with target LDL less than 100  Managed with diet and exercise.  Regular lab monitoring.  5. BMI 27.0-27.9,adult  No significant weight gain or loss.    New complaints: Right sided hip pain described.  Patient saw Dr. Wendi Snipes on 02/11/17 and was given a steroid injection with instructions to f/u if not improved.  She states the shot helped for awhile, but her hip is starting to hurt again and the pain is described as a dull ache.  Patient has been trying to use anti-inflammatories she had at home and these are not really helping (unable to remember the exact name). Wants a refill on adipex  to have just as needed  Social history: Still working- has 2 grandsons who are the love of her life   Review of Systems  Constitutional: Negative for activity change, appetite change and fatigue.  Respiratory: Negative for cough.   Cardiovascular: Negative for chest pain and palpitations.  Musculoskeletal: Positive for arthralgias (right hip).  Neurological: Negative for dizziness, light-headedness and headaches.  All other systems reviewed and are negative.      Objective:   Physical Exam  Constitutional: She is oriented to person, place, and time. She appears well-developed and well-nourished. No distress.  HENT:  Head: Normocephalic.  Right Ear: External ear normal.  Left Ear: External ear normal.  Mouth/Throat: Oropharynx is clear and moist.  Eyes: Pupils are equal, round, and reactive to light. Conjunctivae and EOM are normal.  Neck: Normal range of motion. Neck supple. No thyromegaly present.  Cardiovascular: Normal rate, regular rhythm, normal heart sounds and intact distal pulses.   No murmur heard. Pulmonary/Chest: Effort normal and breath sounds normal. No respiratory distress. She has no wheezes.  Abdominal: Soft. Bowel sounds are normal. She exhibits no distension. There is no tenderness.  Musculoskeletal: Normal range of motion. She exhibits no edema.  Lymphadenopathy:    She has no cervical adenopathy.  Neurological: She is alert and oriented to person, place, and time. She has normal reflexes.  Skin: Skin is warm and dry.  Psychiatric: She has a normal mood and affect. Her behavior is normal.   BP 102/63   Pulse 72  Temp (!) 97.4 F (36.3 C) (Oral)   Ht 5' 7"  (1.702 m)   Wt 163 lb (73.9 kg)   BMI 25.53 kg/m     Assessment & Plan:   Annual physical exam- no pap  1. Essential hypertension Low sodium diet - triamterene-hydrochlorothiazide (MAXZIDE) 75-50 MG tablet; Take 1 tablet by mouth daily.  Dispense: 90 tablet; Refill: 1  2. Gastroesophageal  reflux disease without esophagitis Avoid spicy foods Do not eat 2 hours prior to bedtime  3. Osteoporosis without current pathological fracture, unspecified osteoporosis type Weight bearing exercises encouraged - alendronate (FOSAMAX) 70 MG tablet; TAKE ONE TABLET BY MOUTH ONCE A WEEK. TAKE WITH A FULL GLASS OF WATER ON AN EMPTY STOMACH  Dispense: 12 tablet; Refill: 1  4. Hyperlipidemia with target LDL less than 100 Low fat diet  5. BMI 27.0-27.9,adult Discussed diet and exercise for person with BMI >25 Will recheck weight in 3-6 months - phentermine (ADIPEX-P) 37.5 MG tablet; Take 1 tablet (37.5 mg total) by mouth daily before breakfast.  Dispense: 30 tablet; Refill: 1  6. Pain of right hip joint Take with food - diclofenac (VOLTAREN) 75 MG EC tablet; Take 1 tablet (75 mg total) by mouth 2 (two) times daily.  Dispense: 60 tablet; Refill: 3    Labs pending Health maintenance reviewed Diet and exercise encouraged Continue all meds Follow up  In 6 months   [place], FNP

## 2017-03-30 NOTE — Addendum Note (Signed)
Addended by: Rolena Infante on: 03/30/2017 10:27 AM   Modules accepted: Orders

## 2017-03-30 NOTE — Patient Instructions (Signed)
Hip Bursitis Hip bursitis is swelling of a fluid-filled sac (bursa) in your hip. This swelling (inflammation) can be painful. This condition may come and go over time. Follow these instructions at home: Medicines  Take over-the-counter and prescription medicines only as told by your doctor.  Do not drive or use heavy machinery while taking prescription pain medicine, or as told by your doctor.  If you were prescribed an antibiotic medicine, take it as told by your doctor. Do not stop taking the antibiotic even if you start to feel better. Activity  Return to your normal activities as told by your doctor. Ask your doctor what activities are safe for you.  Rest and protect your hip until you feel better. General instructions  Wear wraps that put pressure on your hip (compression wraps) only as told by your doctor.  Raise (elevate) your hip above the level of your heart as much as you can. To do this, try putting a pillow under your hips while you lie down. Stop if this causes pain.  Do not use your hip to support your body weight until your doctor says that you can.  Use crutches as told by your doctor.  Gently rub and stretch your injured area as often as is comfortable.  Keep all follow-up visits as told by your doctor. This is important. How is this prevented?  Exercise regularly, as told by your doctor.  Warm up and stretch before being active.  Cool down and stretch after being active.  Avoid activities that bother your hip or cause pain.  Avoid sitting down for long periods at a time. Contact a doctor if:  You have a fever.  You get new symptoms.  You have trouble walking.  You have trouble doing everyday activities.  You have pain that gets worse.  You have pain that does not get better with medicine.  You get red skin on your hip area.  You get a feeling of warmth in your hip area. Get help right away if:  You cannot move your hip.  You have very bad  pain. This information is not intended to replace advice given to you by your health care provider. Make sure you discuss any questions you have with your health care provider. Document Released: 07/04/2010 Document Revised: 11/07/2015 Document Reviewed: 01/01/2015 Elsevier Interactive Patient Education  [employer].

## 2017-03-30 NOTE — Addendum Note (Signed)
Addended by: Chevis Pretty on: 03/30/2017 10:15 AM   Modules accepted: Orders

## 2017-03-31 LAB — CBC WITH DIFFERENTIAL/PLATELET
BASOS ABS: 0 10*3/uL (ref 0.0–0.2)
Basos: 0 %
EOS (ABSOLUTE): 0.4 10*3/uL (ref 0.0–0.4)
Eos: 5 %
HEMOGLOBIN: 13.8 g/dL (ref 11.1–15.9)
Hematocrit: 40.2 % (ref 34.0–46.6)
IMMATURE GRANS (ABS): 0 10*3/uL (ref 0.0–0.1)
Immature Granulocytes: 0 %
LYMPHS: 30 %
Lymphocytes Absolute: 2.4 10*3/uL (ref 0.7–3.1)
MCH: 30.7 pg (ref 26.6–33.0)
MCHC: 34.3 g/dL (ref 31.5–35.7)
MCV: 89 fL (ref 79–97)
MONOCYTES: 9 %
Monocytes Absolute: 0.7 10*3/uL (ref 0.1–0.9)
NEUTROS PCT: 56 %
Neutrophils Absolute: 4.6 10*3/uL (ref 1.4–7.0)
PLATELETS: 294 10*3/uL (ref 150–379)
RBC: 4.5 x10E6/uL (ref 3.77–5.28)
RDW: 13.4 % (ref 12.3–15.4)
WBC: 8 10*3/uL (ref 3.4–10.8)

## 2017-03-31 LAB — THYROID PANEL WITH TSH
FREE THYROXINE INDEX: 1.9 (ref 1.2–4.9)
T3 UPTAKE RATIO: 25 % (ref 24–39)
T4, Total: 7.6 ug/dL (ref 4.5–12.0)
TSH: 1.87 u[IU]/mL (ref 0.450–4.500)

## 2017-03-31 LAB — CMP14+EGFR
ALBUMIN: 4 g/dL (ref 3.5–5.5)
ALK PHOS: 70 IU/L (ref 39–117)
ALT: 38 IU/L — ABNORMAL HIGH (ref 0–32)
AST: 36 IU/L (ref 0–40)
Albumin/Globulin Ratio: 1.4 (ref 1.2–2.2)
BUN / CREAT RATIO: 20 (ref 9–23)
BUN: 14 mg/dL (ref 6–24)
Bilirubin Total: 0.4 mg/dL (ref 0.0–1.2)
CO2: 24 mmol/L (ref 20–29)
CREATININE: 0.71 mg/dL (ref 0.57–1.00)
Calcium: 8.7 mg/dL (ref 8.7–10.2)
Chloride: 106 mmol/L (ref 96–106)
GFR calc Af Amer: 109 mL/min/{1.73_m2} (ref >59)
GFR, EST NON AFRICAN AMERICAN: 95 mL/min/{1.73_m2} (ref >59)
GLUCOSE: 95 mg/dL (ref 65–99)
Globulin, Total: 2.8 g/dL (ref 1.5–4.5)
Potassium: 4.3 mmol/L (ref 3.5–5.2)
Sodium: 143 mmol/L (ref 134–144)
TOTAL PROTEIN: 6.8 g/dL (ref 6.0–8.5)

## 2017-03-31 LAB — LIPID PANEL
Chol/HDL Ratio: 3.9 ratio (ref 0.0–4.4)
Cholesterol, Total: 170 mg/dL (ref 100–199)
HDL: 44 mg/dL (ref >39)
LDL Calculated: 110 mg/dL — ABNORMAL HIGH (ref 0–99)
Triglycerides: 80 mg/dL (ref 0–149)
VLDL CHOLESTEROL CAL: 16 mg/dL (ref 5–40)

## 2017-04-08 ENCOUNTER — Telehealth: Payer: Self-pay

## 2017-04-08 NOTE — Telephone Encounter (Signed)
Insurance never pays for phentermine- patient has to pay cash

## 2017-04-08 NOTE — Telephone Encounter (Signed)
Insurance denied prior auth for Phentermine

## 2017-04-20 DIAGNOSIS — H2513 Age-related nuclear cataract, bilateral: Secondary | ICD-10-CM | POA: Diagnosis not present

## 2017-04-20 DIAGNOSIS — H43811 Vitreous degeneration, right eye: Secondary | ICD-10-CM | POA: Diagnosis not present

## 2017-04-20 DIAGNOSIS — H43391 Other vitreous opacities, right eye: Secondary | ICD-10-CM | POA: Diagnosis not present

## 2017-04-20 DIAGNOSIS — H40053 Ocular hypertension, bilateral: Secondary | ICD-10-CM | POA: Diagnosis not present

## 2017-04-29 DIAGNOSIS — M79671 Pain in right foot: Secondary | ICD-10-CM | POA: Diagnosis not present

## 2017-04-29 DIAGNOSIS — M2041 Other hammer toe(s) (acquired), right foot: Secondary | ICD-10-CM | POA: Diagnosis not present

## 2017-05-17 ENCOUNTER — Telehealth: Payer: Self-pay | Admitting: Nurse Practitioner

## 2017-05-18 NOTE — Telephone Encounter (Signed)
Faxed per pt

## 2017-05-19 DIAGNOSIS — H5213 Myopia, bilateral: Secondary | ICD-10-CM | POA: Diagnosis not present

## 2017-05-26 DIAGNOSIS — J069 Acute upper respiratory infection, unspecified: Secondary | ICD-10-CM | POA: Diagnosis not present

## 2017-05-27 DIAGNOSIS — L84 Corns and callosities: Secondary | ICD-10-CM | POA: Diagnosis not present

## 2017-05-27 DIAGNOSIS — M2041 Other hammer toe(s) (acquired), right foot: Secondary | ICD-10-CM | POA: Diagnosis not present

## 2017-06-04 ENCOUNTER — Telehealth: Payer: Self-pay | Admitting: Nurse Practitioner

## 2017-06-04 MED ORDER — OSELTAMIVIR PHOSPHATE 75 MG PO CAPS: 75.0000 mg | ORAL_CAPSULE | Freq: Every day | ORAL | 0 refills | Status: DC

## 2017-06-04 NOTE — Telephone Encounter (Signed)
tamiflu sent to pharmacy 

## 2017-06-10 DIAGNOSIS — M79674 Pain in right toe(s): Secondary | ICD-10-CM | POA: Diagnosis not present

## 2017-06-10 DIAGNOSIS — M2041 Other hammer toe(s) (acquired), right foot: Secondary | ICD-10-CM | POA: Diagnosis not present

## 2017-07-01 DIAGNOSIS — M2041 Other hammer toe(s) (acquired), right foot: Secondary | ICD-10-CM | POA: Diagnosis not present

## 2017-07-01 DIAGNOSIS — M79674 Pain in right toe(s): Secondary | ICD-10-CM | POA: Diagnosis not present

## 2017-11-16 IMAGING — CR DG CHEST 2V
2 series · 2 of 2 positions shown · non-contrast
Comparison: No prior.

CLINICAL DATA: Hypertension .  Yearly exam.

EXAM:
CHEST  2 VIEW

[view not recorded (1 of 2)]
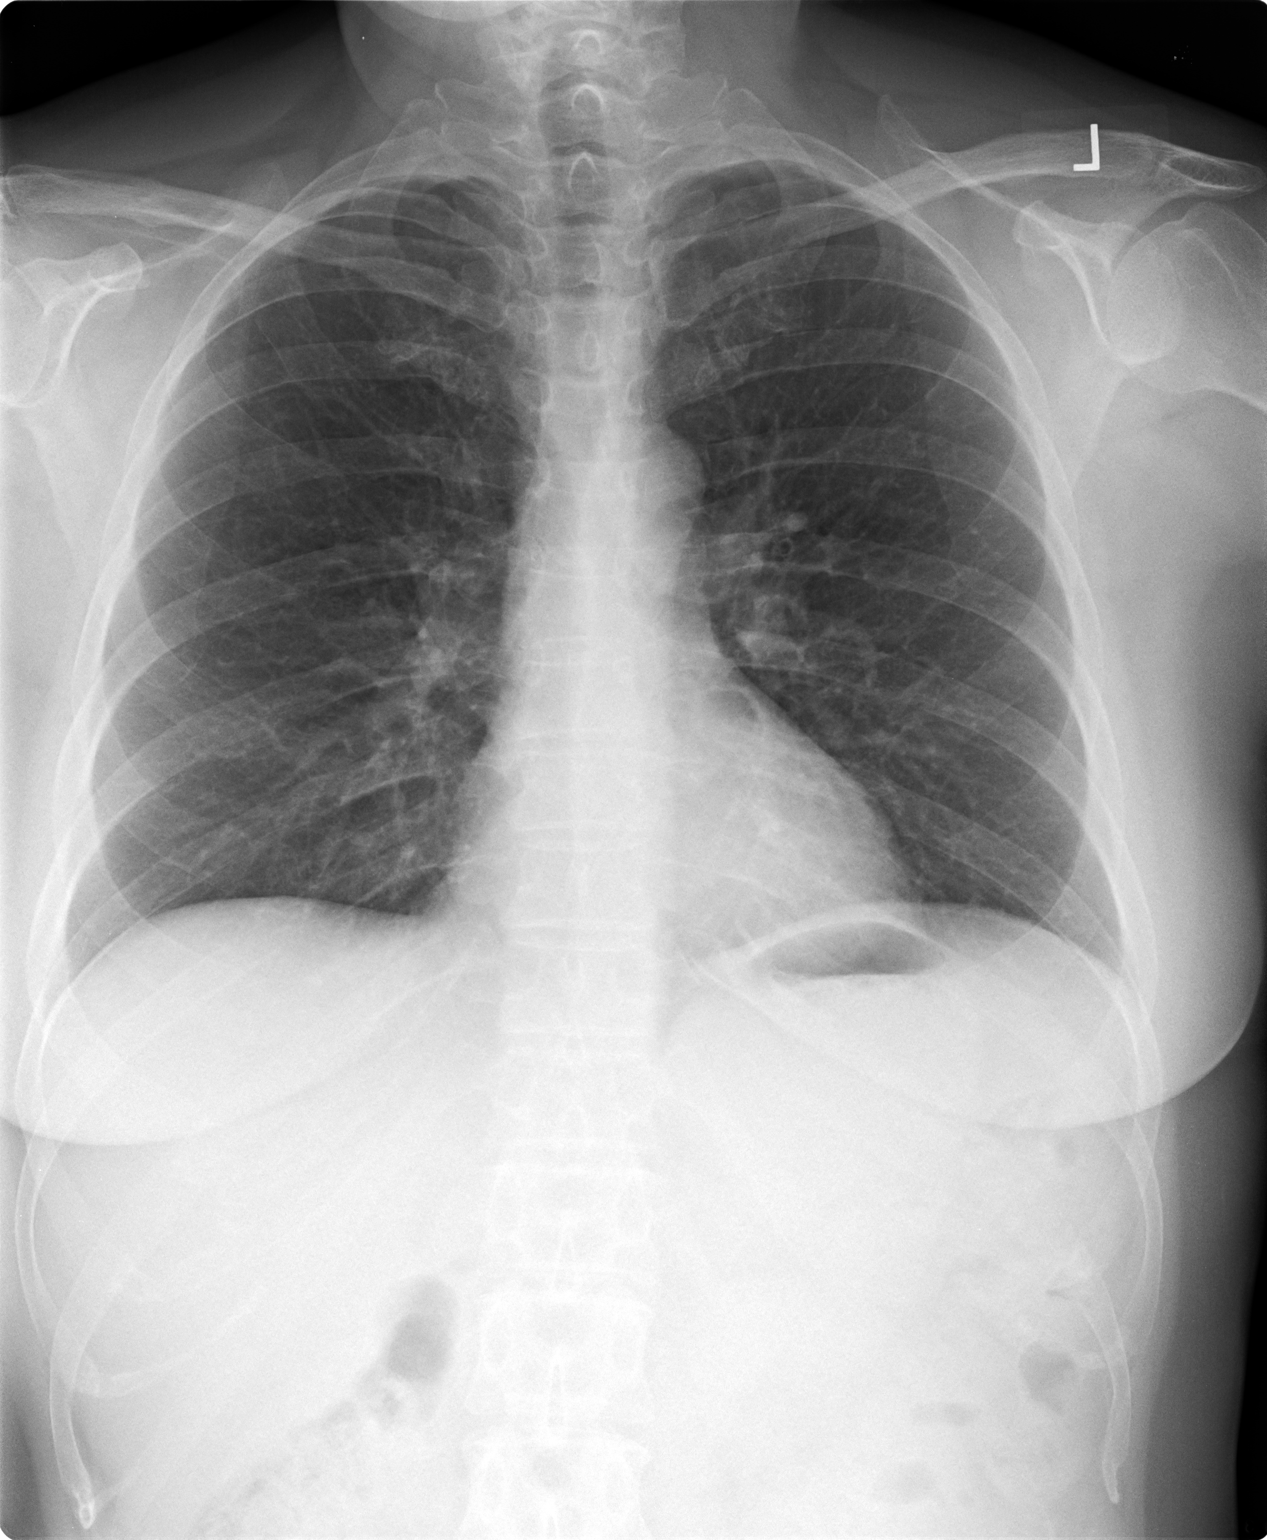

[view not recorded (2 of 2)]
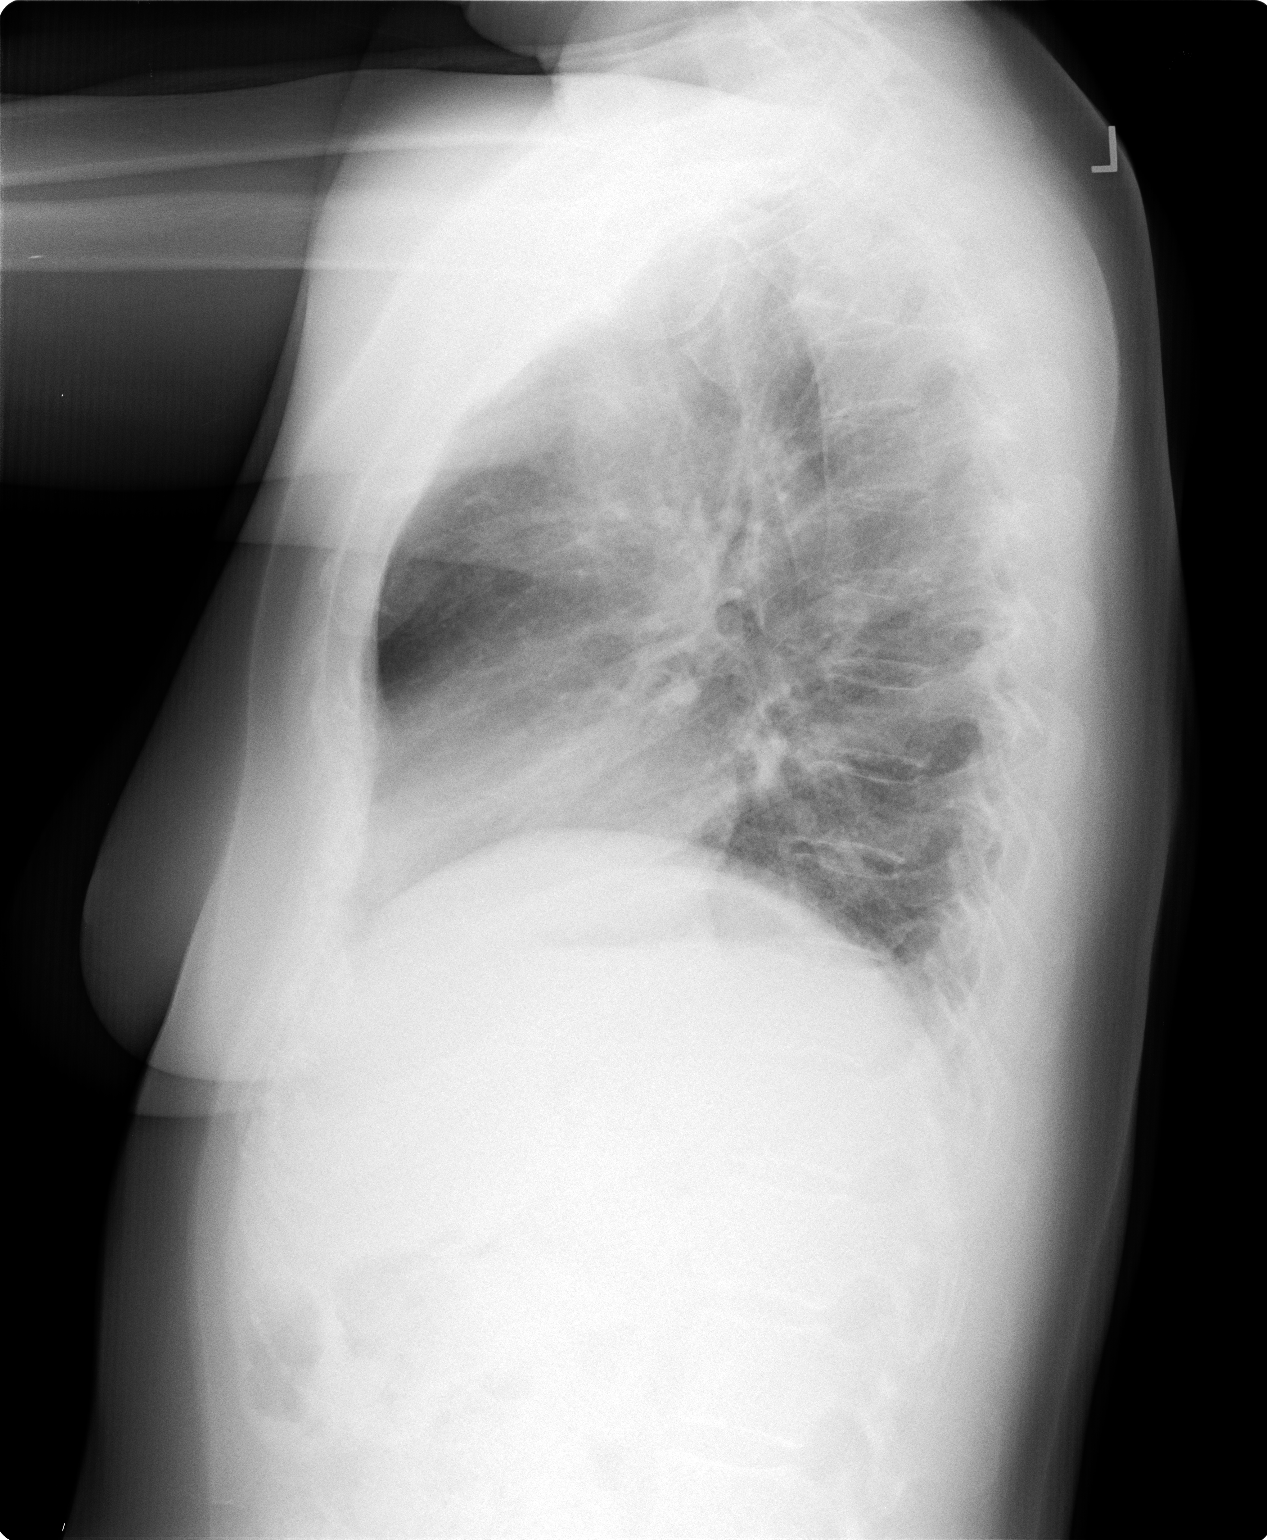

[2 of 2 positions shown; findings below may reference images not displayed]

FINDINGS: The heart size and mediastinal contours are within normal limits.
Both lungs are clear. The visualized skeletal structures are
unremarkable.
IMPRESSION: No active cardiopulmonary disease.

## 2017-11-17 ENCOUNTER — Other Ambulatory Visit: Payer: Self-pay | Admitting: Nurse Practitioner

## 2017-11-17 DIAGNOSIS — M810 Age-related osteoporosis without current pathological fracture: Secondary | ICD-10-CM

## 2017-11-17 MED ORDER — ALENDRONATE SODIUM 70 MG PO TABS: ORAL_TABLET | ORAL | 0 refills | Status: DC

## 2017-11-17 NOTE — Telephone Encounter (Signed)
6 mos appt made for 12/07/17 1 mos refill sent to pharmacy

## 2017-11-23 ENCOUNTER — Encounter: Payer: Self-pay | Admitting: Nurse Practitioner

## 2017-11-23 ENCOUNTER — Ambulatory Visit: Payer: BLUE CROSS/BLUE SHIELD | Admitting: Nurse Practitioner

## 2017-11-23 VITALS — BP 116/74 | HR 75 | Temp 97.5°F | Ht 67.0 in | Wt 169.0 lb

## 2017-11-23 DIAGNOSIS — B370 Candidal stomatitis: Secondary | ICD-10-CM

## 2017-11-23 DIAGNOSIS — R682 Dry mouth, unspecified: Secondary | ICD-10-CM

## 2017-11-23 DIAGNOSIS — R6889 Other general symptoms and signs: Secondary | ICD-10-CM | POA: Diagnosis not present

## 2017-11-23 DIAGNOSIS — R5383 Other fatigue: Secondary | ICD-10-CM | POA: Diagnosis not present

## 2017-11-23 MED ORDER — NYSTATIN 100000 UNIT/ML MT SUSP: 5.0000 mL | Freq: Four times a day (QID) | OROMUCOSAL | 0 refills | Status: DC

## 2017-11-23 NOTE — Progress Notes (Signed)
   Subjective:    Patient ID: Maria [name], female    DOB: 13-Apr-1961, 57 y.o.   MRN: [id number]   Chief Complaint: dry mouth; burning tongue; and Fatigue   HPI Patient come sin today c/o dry mouth and tongue being sore. When this has happened in the past her b12 was low and she would just take b12 OTC. She has been taking b12 OTC for the a st 4 years.   Review of Systems  Constitutional: Negative for activity change and appetite change.  HENT: Negative.   Eyes: Negative for pain.  Respiratory: Negative for shortness of breath.   Cardiovascular: Negative for chest pain, palpitations and leg swelling.  Gastrointestinal: Negative for abdominal pain.  Endocrine: Negative for polydipsia.  Genitourinary: Negative.   Skin: Negative for rash.  Neurological: Negative for dizziness, weakness and headaches.  Hematological: Does not bruise/bleed easily.  Psychiatric/Behavioral: Negative.   All other systems reviewed and are negative.      Objective:   Physical Exam  Constitutional: She is oriented to person, place, and time. She appears well-developed and well-nourished. No distress.  HENT:  White film on tongue  Cardiovascular: Normal rate and regular rhythm.  Pulmonary/Chest: Effort normal and breath sounds normal.  Neurological: She is alert and oriented to person, place, and time.  Skin: Skin is warm and dry.  Psychiatric: She has a normal mood and affect. Her behavior is normal. Judgment and thought content normal.  Nursing note and vitals reviewed.  BP 116/74   Pulse 75   Temp (!) 97.5 F (36.4 C) (Oral)   Ht 5' 7"  (1.702 m)   Wt 169 lb (76.7 kg)   BMI 26.47 kg/m       Assessment & Plan:  Maria [name] in today with chief complaint of dry mouth; burning tongue; and Fatigue   1. Dry mouth Force fluids  2. Fatigue, unspecified type - Vitamin B12 - CMP14+EGFR  3. Thrush, oral - nystatin (MYCOSTATIN) 100000 UNIT/ML suspension; Take 5 mLs (500,000 Units total) by  mouth 4 (four) times daily.  Dispense: 60 mL; Refill: 0  Will talk when lab results are back  [place], FNP

## 2017-11-23 NOTE — Patient Instructions (Signed)
Oral Thrush, Adult Oral thrush is an infection in your mouth and throat. It causes white patches on your tongue and in your mouth. Follow these instructions at home: Helping with soreness  To lessen your pain: ? Drink cold liquids, like water and iced tea. ? Eat frozen ice pops or frozen juices. ? Eat foods that are easy to swallow, like gelatin and ice cream. ? Drink from a straw if the patches in your mouth are painful. General instructions   Take or use over-the-counter and prescription medicines only as told by your doctor. Medicine for oral thrush may be something to swallow, or it may be something to put on the infected area.  Eat plain yogurt that has live cultures in it. Read the label to make sure.  If you wear dentures: ? Take out your dentures before you go to bed. ? Brush them well. ? Soak them in a denture cleaner.  Rinse your mouth with warm salt-water many times a day. To make the salt-water mixture, completely dissolve 1/2-1 teaspoon of salt in 1 cup of warm water. Contact a doctor if:  Your problems are getting worse.  Your problems do not get better in less than 7 days with treatment.  Your infection is spreading. This may show as white patches on the skin outside of your mouth.  You are nursing your baby and you have redness and pain in the nipples. This information is not intended to replace advice given to you by your health care provider. Make sure you discuss any questions you have with your health care provider. Document Released: 08/26/2009 Document Revised: 02/24/2016 Document Reviewed: 02/24/2016 Elsevier Interactive Patient Education  2017 [employer].

## 2017-11-24 LAB — CMP14+EGFR
A/G RATIO: 1.5 (ref 1.2–2.2)
ALBUMIN: 4.7 g/dL (ref 3.5–5.5)
ALT: 26 IU/L (ref 0–32)
AST: 25 IU/L (ref 0–40)
Alkaline Phosphatase: 88 IU/L (ref 39–117)
BUN / CREAT RATIO: 16 (ref 9–23)
BUN: 11 mg/dL (ref 6–24)
Bilirubin Total: 0.3 mg/dL (ref 0.0–1.2)
CALCIUM: 10.5 mg/dL — AB (ref 8.7–10.2)
CO2: 27 mmol/L (ref 20–29)
Chloride: 95 mmol/L — ABNORMAL LOW (ref 96–106)
Creatinine, Ser: 0.69 mg/dL (ref 0.57–1.00)
GFR, EST AFRICAN AMERICAN: 112 mL/min/{1.73_m2} (ref >59)
GFR, EST NON AFRICAN AMERICAN: 97 mL/min/{1.73_m2} (ref >59)
Globulin, Total: 3.1 g/dL (ref 1.5–4.5)
Glucose: 99 mg/dL (ref 65–99)
Potassium: 3.7 mmol/L (ref 3.5–5.2)
Sodium: 139 mmol/L (ref 134–144)
TOTAL PROTEIN: 7.8 g/dL (ref 6.0–8.5)

## 2017-11-24 LAB — VITAMIN B12

## 2017-12-07 ENCOUNTER — Ambulatory Visit: Payer: BLUE CROSS/BLUE SHIELD | Admitting: Nurse Practitioner

## 2017-12-20 ENCOUNTER — Telehealth: Payer: Self-pay | Admitting: Nurse Practitioner

## 2017-12-20 DIAGNOSIS — M810 Age-related osteoporosis without current pathological fracture: Secondary | ICD-10-CM

## 2017-12-20 MED ORDER — ALENDRONATE SODIUM 70 MG PO TABS: ORAL_TABLET | ORAL | 0 refills | Status: DC

## 2017-12-20 NOTE — Telephone Encounter (Signed)
Fosamax sent to pharmacy

## 2018-01-21 ENCOUNTER — Other Ambulatory Visit: Payer: Self-pay | Admitting: Nurse Practitioner

## 2018-01-21 DIAGNOSIS — M810 Age-related osteoporosis without current pathological fracture: Secondary | ICD-10-CM

## 2018-03-22 ENCOUNTER — Encounter: Payer: Self-pay | Admitting: Nurse Practitioner

## 2018-03-22 ENCOUNTER — Telehealth: Payer: Self-pay | Admitting: Nurse Practitioner

## 2018-03-22 ENCOUNTER — Ambulatory Visit: Payer: BLUE CROSS/BLUE SHIELD | Admitting: Nurse Practitioner

## 2018-03-22 VITALS — BP 115/72 | HR 81 | Temp 98.6°F | Ht 67.0 in | Wt 167.0 lb

## 2018-03-22 DIAGNOSIS — J40 Bronchitis, not specified as acute or chronic: Secondary | ICD-10-CM | POA: Diagnosis not present

## 2018-03-22 MED ORDER — HYDROCODONE-HOMATROPINE 5-1.5 MG/5ML PO SYRP: 5.0000 mL | ORAL_SOLUTION | Freq: Four times a day (QID) | ORAL | 0 refills | Status: DC | PRN

## 2018-03-22 MED ORDER — PREDNISONE 20 MG PO TABS: ORAL_TABLET | ORAL | 0 refills | Status: DC

## 2018-03-22 NOTE — Telephone Encounter (Signed)
Order pending- please advise

## 2018-03-22 NOTE — Progress Notes (Signed)
Subjective:    Patient ID: Maria [name], female    DOB: September 10, 1960, 57 y.o.   MRN: [id number]   Chief Complaint: Cough   HPI Patient comes in today c/o cough that started over a week ago. She has tried mucinex OTC which has not helped.   Review of Systems  Constitutional: Negative for chills, fatigue and fever.  HENT: Positive for congestion. Negative for ear pain, postnasal drip, rhinorrhea, sore throat and trouble swallowing.   Respiratory: Positive for cough. Negative for shortness of breath.   Cardiovascular: Negative.   Gastrointestinal: Negative.   Neurological: Negative for headaches.  Psychiatric/Behavioral: Negative.   All other systems reviewed and are negative.      Objective:   Physical Exam  Constitutional: She is oriented to person, place, and time. She appears well-developed and well-nourished. No distress.  HENT:  Head: Normocephalic.  Nose: Nose normal.  Mouth/Throat: Oropharynx is clear and moist.  Eyes: Pupils are equal, round, and reactive to light. EOM are normal.  Neck: Normal range of motion. Neck supple. No JVD present. Carotid bruit is not present.  Cardiovascular: Normal rate, regular rhythm, normal heart sounds and intact distal pulses.  Pulmonary/Chest: Effort normal and breath sounds normal. No respiratory distress. She has no wheezes. She has no rales. She exhibits no tenderness.  Dry tight cough   Abdominal: Soft. Normal appearance, normal aorta and bowel sounds are normal. She exhibits no distension, no abdominal bruit, no pulsatile midline mass and no mass. There is no splenomegaly or hepatomegaly. There is no tenderness.  Musculoskeletal: Normal range of motion. She exhibits no edema.  Lymphadenopathy:    She has no cervical adenopathy.  Neurological: She is alert and oriented to person, place, and time. She has normal reflexes.  Skin: Skin is warm and dry.  Psychiatric: She has a normal mood and affect. Her behavior is normal. Judgment and  thought content normal.  Nursing note and vitals reviewed.  BP 115/72   Pulse 81   Temp 98.6 F (37 C) (Oral)   Ht 5' 7"  (1.702 m)   Wt 167 lb (75.8 kg)   BMI 26.16 kg/m      Assessment & Plan:  Kalaya Infantino in today with chief complaint of Cough   1. Bronchitis Meds ordered this encounter  Medications  . HYDROcodone-homatropine (HYCODAN) 5-1.5 MG/5ML syrup    Sig: Take 5 mLs by mouth every 6 (six) hours as needed for cough.    Dispense:  120 mL    Refill:  0    Order Specific Question:   Supervising Provider    Answer:   VINCENT, CAROL L [[id number]]  . predniSONE (DELTASONE) 20 MG tablet    Sig: 2 po at sametime daily for 5 days    Dispense:  10 tablet    Refill:  0    Order Specific Question:   Supervising Provider    Answer:   VINCENT, CAROL L [[id number]]   1. Take meds as prescribed 2. Use a cool mist humidifier especially during the winter months and when heat has been humid. 3. Use saline nose sprays frequently 4. Saline irrigations of the nose can be very helpful if done frequently.  * 4X daily for 1 week*  * Use of a nettie pot can be helpful with this. Follow directions with this* 5. Drink plenty of fluids 6. Keep thermostat turn down low 7.For any cough or congestion  Use plain Mucinex- regular strength or max strength is fine   *  Children- consult with Pharmacist for dosing 8. For fever or aces or pains- take tylenol or ibuprofen appropriate for age and weight.  * for fevers greater than 101 orally you may alternate ibuprofen and tylenol every  3 hours.   Mary-Margaret Hassell Done, FNP

## 2018-03-22 NOTE — Patient Instructions (Signed)

## 2018-03-23 MED ORDER — HYDROCODONE-HOMATROPINE 5-1.5 MG/5ML PO SYRP: 5.0000 mL | ORAL_SOLUTION | Freq: Four times a day (QID) | ORAL | 0 refills | Status: DC | PRN

## 2018-03-25 ENCOUNTER — Other Ambulatory Visit: Payer: Self-pay | Admitting: Nurse Practitioner

## 2018-03-25 DIAGNOSIS — R059 Cough, unspecified: Secondary | ICD-10-CM

## 2018-03-25 DIAGNOSIS — R05 Cough: Secondary | ICD-10-CM

## 2018-03-25 MED ORDER — HYDROCODONE-HOMATROPINE 5-1.5 MG/5ML PO SYRP: 5.0000 mL | ORAL_SOLUTION | Freq: Four times a day (QID) | ORAL | 0 refills | Status: DC | PRN

## 2018-03-25 MED ORDER — BENZONATATE 100 MG PO CAPS: 100.0000 mg | ORAL_CAPSULE | Freq: Two times a day (BID) | ORAL | 0 refills | Status: DC | PRN

## 2018-03-25 NOTE — Telephone Encounter (Signed)
PT states that [facility] still do not have the HYDROcodone-homatropine (HYCODAN) 5-1.5 MG/5ML syrup in and pt is coughing really bad wants to know if we can send in [facility]

## 2018-03-25 NOTE — Telephone Encounter (Signed)
It was sent in to [facility] on 10/9, I resent it into [place]

## 2018-03-25 NOTE — Telephone Encounter (Signed)
Tessalon has been sent to [facility] in [place].  Per patient's request

## 2018-05-03 ENCOUNTER — Other Ambulatory Visit: Payer: Self-pay | Admitting: Physician Assistant

## 2018-05-03 DIAGNOSIS — D045 Carcinoma in situ of skin of trunk: Secondary | ICD-10-CM | POA: Diagnosis not present

## 2018-05-03 DIAGNOSIS — D485 Neoplasm of uncertain behavior of skin: Secondary | ICD-10-CM | POA: Diagnosis not present

## 2018-05-03 DIAGNOSIS — L570 Actinic keratosis: Secondary | ICD-10-CM | POA: Diagnosis not present

## 2018-05-03 DIAGNOSIS — C4491 Basal cell carcinoma of skin, unspecified: Secondary | ICD-10-CM

## 2018-05-03 DIAGNOSIS — C4492 Squamous cell carcinoma of skin, unspecified: Secondary | ICD-10-CM

## 2018-05-03 DIAGNOSIS — C44612 Basal cell carcinoma of skin of right upper limb, including shoulder: Secondary | ICD-10-CM | POA: Diagnosis not present

## 2018-05-03 HISTORY — DX: Squamous cell carcinoma of skin, unspecified: C44.92

## 2018-05-03 HISTORY — DX: Basal cell carcinoma of skin, unspecified: C44.91

## 2018-05-11 ENCOUNTER — Telehealth: Payer: Self-pay | Admitting: *Deleted

## 2018-06-02 DIAGNOSIS — C44612 Basal cell carcinoma of skin of right upper limb, including shoulder: Secondary | ICD-10-CM | POA: Diagnosis not present

## 2018-06-02 DIAGNOSIS — D045 Carcinoma in situ of skin of trunk: Secondary | ICD-10-CM | POA: Diagnosis not present

## 2018-06-16 NOTE — Telephone Encounter (Signed)
lmtcb for flu shot 

## 2018-07-18 ENCOUNTER — Other Ambulatory Visit: Payer: Self-pay | Admitting: Nurse Practitioner

## 2018-07-18 DIAGNOSIS — Z1231 Encounter for screening mammogram for malignant neoplasm of breast: Secondary | ICD-10-CM

## 2018-07-28 ENCOUNTER — Encounter: Payer: Self-pay | Admitting: Nurse Practitioner

## 2018-07-28 ENCOUNTER — Ambulatory Visit (INDEPENDENT_AMBULATORY_CARE_PROVIDER_SITE_OTHER): Payer: BLUE CROSS/BLUE SHIELD | Admitting: Nurse Practitioner

## 2018-07-28 ENCOUNTER — Ambulatory Visit (INDEPENDENT_AMBULATORY_CARE_PROVIDER_SITE_OTHER): Payer: BLUE CROSS/BLUE SHIELD

## 2018-07-28 VITALS — BP 99/68 | HR 79 | Temp 98.4°F | Ht 67.0 in | Wt 173.0 lb

## 2018-07-28 DIAGNOSIS — M818 Other osteoporosis without current pathological fracture: Secondary | ICD-10-CM | POA: Diagnosis not present

## 2018-07-28 DIAGNOSIS — K219 Gastro-esophageal reflux disease without esophagitis: Secondary | ICD-10-CM | POA: Diagnosis not present

## 2018-07-28 DIAGNOSIS — E785 Hyperlipidemia, unspecified: Secondary | ICD-10-CM | POA: Diagnosis not present

## 2018-07-28 DIAGNOSIS — Z6827 Body mass index (BMI) 27.0-27.9, adult: Secondary | ICD-10-CM

## 2018-07-28 DIAGNOSIS — Z01411 Encounter for gynecological examination (general) (routine) with abnormal findings: Secondary | ICD-10-CM | POA: Diagnosis not present

## 2018-07-28 DIAGNOSIS — I10 Essential (primary) hypertension: Secondary | ICD-10-CM | POA: Diagnosis not present

## 2018-07-28 DIAGNOSIS — K900 Celiac disease: Secondary | ICD-10-CM

## 2018-07-28 DIAGNOSIS — Z0001 Encounter for general adult medical examination with abnormal findings: Secondary | ICD-10-CM | POA: Diagnosis not present

## 2018-07-28 DIAGNOSIS — M810 Age-related osteoporosis without current pathological fracture: Secondary | ICD-10-CM

## 2018-07-28 DIAGNOSIS — Z01419 Encounter for gynecological examination (general) (routine) without abnormal findings: Secondary | ICD-10-CM

## 2018-07-28 DIAGNOSIS — D509 Iron deficiency anemia, unspecified: Secondary | ICD-10-CM

## 2018-07-28 DIAGNOSIS — Z124 Encounter for screening for malignant neoplasm of cervix: Secondary | ICD-10-CM | POA: Diagnosis not present

## 2018-07-28 DIAGNOSIS — Z0000 Encounter for general adult medical examination without abnormal findings: Secondary | ICD-10-CM

## 2018-07-28 LAB — URINALYSIS, COMPLETE
Bilirubin, UA: NEGATIVE
Glucose, UA: NEGATIVE
KETONES UA: NEGATIVE
Nitrite, UA: NEGATIVE
Protein, UA: NEGATIVE
Specific Gravity, UA: 1.015 (ref 1.005–1.030)
Urobilinogen, Ur: 0.2 mg/dL (ref 0.2–1.0)
pH, UA: 5.5 (ref 5.0–7.5)

## 2018-07-28 LAB — MICROSCOPIC EXAMINATION: Renal Epithel, UA: NONE SEEN /hpf

## 2018-07-28 MED ORDER — TRIAMTERENE-HCTZ 75-50 MG PO TABS: 1.0000 | ORAL_TABLET | Freq: Every day | ORAL | 1 refills | Status: DC

## 2018-07-28 MED ORDER — PHENTERMINE HCL 37.5 MG PO TABS: 37.5000 mg | ORAL_TABLET | Freq: Every day | ORAL | 2 refills | Status: DC

## 2018-07-28 NOTE — Progress Notes (Addendum)
Subjective:    Patient ID: Maria [name], female    DOB: Apr 09, 1961, 58 y.o.   MRN: [id number]   Chief Complaint: Annual Exam   HPI:  1. Annual physical exam   2. Gynecologic exam normal  Last pap was was 09/10/15 and was normal. denies any vaginal discharge. LMP was >5 years ago.  3. Essential hypertension  No c/o chest pain, sob or headache. Does not check blood pressure at home. BP Readings from Last 3 Encounters:  03/22/18 115/72  11/23/17 116/74  03/30/17 102/63     4. Gastroesophageal reflux disease without esophagitis  Is on no rx meds for this- has been doing well . She really has to watch her diet for her celiac disease and that has kept her symptoms under control.  5. Celiac disease  She has really had to watch diet  To keep from getting sick.  6. Osteoporosis without current pathological fracture, unspecified osteoporosis type  last dexascan was done 2017 with t score of -2.7.  7. Iron deficiency anemia, unspecified iron deficiency anemia type  last hgb was 14.8. se I s on no iron supplements  8. Hyperlipidemia with target LDL less than 100  Watches diet and does some exercises during the week.   9. BMI 27.0-27.9,adult  Weight up 6 lbs from last visit. She would like to go back on adipex.    Outpatient Encounter Medications as of 07/28/2018  Medication Sig  . alendronate (FOSAMAX) 70 MG tablet TAKE 1 TABLET BY MOUTH ONCE A WEEK. TAKE WITH A FULL GLASS OF WATER ON AN EMPTY STOMACH  . cholecalciferol (VITAMIN D) 1000 units tablet Take 2 tablets (2,000 Units total) by mouth daily.  . Cyanocobalamin (VITAMIN B 12 PO) Take by mouth.  . Sod Fluoride-Ca Carbonate 8.3-364 MG TABS Take 1 tablet by mouth daily.  Marland Kitchen triamterene-hydrochlorothiazide (MAXZIDE) 75-50 MG tablet Take 1 tablet by mouth daily.    New complaints: None today  Social history: Lives with her husband. Is still working.   Review of Systems  Constitutional: Negative for activity change and appetite  change.  HENT: Negative.   Eyes: Negative for pain.  Respiratory: Negative for shortness of breath.   Cardiovascular: Negative for chest pain, palpitations and leg swelling.  Gastrointestinal: Negative for abdominal pain.  Endocrine: Negative for polydipsia.  Genitourinary: Negative.   Skin: Negative for rash.  Neurological: Negative for dizziness, weakness and headaches.  Hematological: Does not bruise/bleed easily.  Psychiatric/Behavioral: Negative.   All other systems reviewed and are negative.      Objective:   Physical Exam Vitals signs and nursing note reviewed.  Constitutional:      General: She is not in acute distress.    Appearance: Normal appearance. She is well-developed.  HENT:     Head: Normocephalic.     Nose: Nose normal.  Eyes:     Pupils: Pupils are equal, round, and reactive to light.  Neck:     Musculoskeletal: Normal range of motion and neck supple.     Vascular: No carotid bruit or JVD.  Cardiovascular:     Rate and Rhythm: Normal rate and regular rhythm.     Heart sounds: Normal heart sounds.  Pulmonary:     Effort: Pulmonary effort is normal. No respiratory distress.     Breath sounds: Normal breath sounds. No wheezing or rales.  Chest:     Chest wall: No tenderness.  Abdominal:     General: Bowel sounds are normal. There is no  distension or abdominal bruit.     Palpations: Abdomen is soft. There is no hepatomegaly, splenomegaly, mass or pulsatile mass.     Tenderness: There is no abdominal tenderness.  Musculoskeletal: Normal range of motion.  Lymphadenopathy:     Cervical: No cervical adenopathy.  Skin:    General: Skin is warm and dry.  Neurological:     Mental Status: She is alert and oriented to person, place, and time.     Deep Tendon Reflexes: Reflexes are normal and symmetric.  Psychiatric:        Behavior: Behavior normal.        Thought Content: Thought content normal.        Judgment: Judgment normal.    BP 99/68   Pulse 79    Temp 98.4 F (36.9 C) (Oral)   Ht 5' 7"  (1.702 m)   Wt 173 lb (78.5 kg)   BMI 27.10 kg/m        Assessment & Plan:   Maria [name] comes in today with chief complaint of Annual Exam   Diagnosis and orders addressed:  1. Annual physical exam - Thyroid Panel With TSH  2. Gynecologic exam normal - IGP, Aptima HPV, rfx 16/18,45  3. Essential hypertension Low sodium diet - CMP14+EGFR - triamterene-hydrochlorothiazide (MAXZIDE) 75-50 MG tablet; Take 1 tablet by mouth daily.  Dispense: 90 tablet; Refill: 1  4. Gastroesophageal reflux disease without esophagitis Avoid spicy foods Do not eat 2 hours prior to bedtime  5. Celiac disease Continue to watch diet followup with GI as needed  6. Osteoporosis without current pathological fracture, unspecified osteoporosis type Weight bearing exercises encouraged - DG [facility] DEXA  7. Iron deficiency anemia, unspecified iron deficiency anemia type Labs pending - CBC with Differential/Platelet  8. Hyperlipidemia with target LDL less than 100 Low fat diet - Lipid panel  9. BMI 27.0-27.9,adult Discussed diet and exercise for person with BMI >25 Will recheck weight in 3-6 months   Labs pending Health Maintenance reviewed Diet and exercise encouraged  Follow up plan: 6 months   Mary-Margaret Hassell Done, FNP

## 2018-07-28 NOTE — Addendum Note (Signed)
Addended by: Rolena Infante on: 07/28/2018 03:16 PM   Modules accepted: Orders

## 2018-07-28 NOTE — Addendum Note (Signed)
Addended by: Chevis Pretty on: 07/28/2018 09:57 AM   Modules accepted: Orders

## 2018-07-28 NOTE — Patient Instructions (Signed)
Bone Health Bones protect organs, store calcium, anchor muscles, and support the whole body. Keeping your bones strong is important, especially as you get older. You can take actions to help keep your bones strong and healthy. Why is keeping my bones healthy important?  Keeping your bones healthy is important because your body constantly replaces bone cells. Cells get old, and new cells take their place. As we age, we lose bone cells because the body may not be able to make enough new cells to replace the old cells. The amount of bone cells and bone tissue you have is referred to as bone mass. The higher your bone mass, the stronger your bones. The aging process leads to an overall loss of bone mass in the body, which can increase the likelihood of:  Joint pain and stiffness.  Broken bones.  A condition in which the bones become weak and brittle (osteoporosis). A large decline in bone mass occurs in older adults. In women, it occurs about the time of menopause. What actions can I take to keep my bones healthy? Good health habits are important for maintaining healthy bones. This includes eating nutritious foods and exercising regularly. To have healthy bones, you need to get enough of the right minerals and vitamins. Most nutrition experts recommend getting these nutrients from the foods that you eat. In some cases, taking supplements may also be recommended. Doing certain types of exercise is also important for bone health. What are the nutritional recommendations for healthy bones?  Eating a well-balanced diet with plenty of calcium and vitamin D will help to protect your bones. Nutritional recommendations vary from person to person. Ask your health care provider what is healthy for you. Here are some general guidelines. Get enough calcium Calcium is the most important (essential) mineral for bone health. Most people can get enough calcium from their diet, but supplements may be recommended for  people who are at risk for osteoporosis. Good sources of calcium include:  Dairy products, such as low-fat or nonfat milk, cheese, and yogurt.  Dark green leafy vegetables, such as bok choy and broccoli.  Calcium-fortified foods, such as orange juice, cereal, bread, soy beverages, and tofu products.  Nuts, such as almonds. Follow these recommended amounts for daily calcium intake:  Children, age 25-3: 700 mg.  Children, age 67-8: 1,000 mg.  Children, age 59-13: 1,300 mg.  Teens, age 254-18: 1,300 mg.  Adults, age 29-50: 1,000 mg.  Adults, age 676-70: ? Men: 1,000 mg. ? Women: 1,200 mg.  Adults, age 671 or older: 1,200 mg.  Pregnant and breastfeeding females: ? Teens: 1,300 mg. ? Adults: 1,000 mg. Get enough vitamin D Vitamin D is the most essential vitamin for bone health. It helps the body absorb calcium. Sunlight stimulates the skin to make vitamin D, so be sure to get enough sunlight. If you live in a cold climate or you do not get outside often, your health care provider may recommend that you take vitamin D supplements. Good sources of vitamin D in your diet include:  Egg yolks.  Saltwater fish.  Milk and cereal fortified with vitamin D. Follow these recommended amounts for daily vitamin D intake:  Children and teens, age 25-18: 600 international units.  Adults, age 50 or younger: 400-800 international units.  Adults, age 673 or older: 800-1,000 international units. Get other important nutrients Other nutrients that are important for bone health include:  Phosphorus. This mineral is found in meat, poultry, dairy foods, nuts, and legumes. The  recommended daily intake for adult men and adult women is 700 mg.  Magnesium. This mineral is found in seeds, nuts, dark green vegetables, and legumes. The recommended daily intake for adult men is 400-420 mg. For adult women, it is 310-320 mg.  Vitamin K. This vitamin is found in green leafy vegetables. The recommended daily  intake is 120 mg for adult men and 90 mg for adult women. What type of physical activity is best for building and maintaining healthy bones? Weight-bearing and strength-building activities are important for building and maintaining healthy bones. Weight-bearing activities cause muscles and bones to work against gravity. Strength-building activities increase the strength of the muscles that support bones. Weight-bearing and muscle-building activities include:  Walking and hiking.  Jogging and running.  Dancing.  Gym exercises.  Lifting weights.  Tennis and racquetball.  Climbing stairs.  Aerobics. Adults should get at least 30 minutes of moderate physical activity on most days. Children should get at least 60 minutes of moderate physical activity on most days. Ask your health care provider what type of exercise is best for you. How can I find out if my bone mass is low? Bone mass can be measured with an X-ray test called a bone mineral density (BMD) test. This test is recommended for all women who are age 54 or older. It may also be recommended for:  Men who are age 78 or older.  People who are at risk for osteoporosis because of: ? Having bones that break easily. ? Having a long-term disease that weakens bones, such as kidney disease or rheumatoid arthritis. ? Having menopause earlier than normal. ? Taking medicine that weakens bones, such as steroids, thyroid hormones, or hormone treatment for breast cancer or prostate cancer. ? Smoking. ? Drinking three or more alcoholic drinks a day. If you find that you have a low bone mass, you may be able to prevent osteoporosis or further bone loss by changing your diet and lifestyle. Where can I find more information? For more information, check out the following websites:  [place]: AviationTales.fr  [employer] of Health: www.bones.SouthExposed.es  International Osteoporosis Foundation:  Administrator.iofbonehealth.org Summary  The aging process leads to an overall loss of bone mass in the body, which can increase the likelihood of broken bones and osteoporosis.  Eating a well-balanced diet with plenty of calcium and vitamin D will help to protect your bones.  Weight-bearing and strength-building activities are also important for building and maintaining strong bones.  Bone mass can be measured with an X-ray test called a bone mineral density (BMD) test. This information is not intended to replace advice given to you by your health care provider. Make sure you discuss any questions you have with your health care provider. Document Released: 08/22/2003 Document Revised: 06/28/2017 Document Reviewed: 06/28/2017 Elsevier Interactive Patient Education  2019 [employer].

## 2018-07-29 LAB — CBC WITH DIFFERENTIAL/PLATELET
BASOS ABS: 0.1 10*3/uL (ref 0.0–0.2)
BASOS: 1 %
EOS (ABSOLUTE): 0.3 10*3/uL (ref 0.0–0.4)
Eos: 4 %
Hematocrit: 43.5 % (ref 34.0–46.6)
Hemoglobin: 14.6 g/dL (ref 11.1–15.9)
IMMATURE GRANS (ABS): 0 10*3/uL (ref 0.0–0.1)
IMMATURE GRANULOCYTES: 0 %
LYMPHS: 39 %
Lymphocytes Absolute: 2.7 10*3/uL (ref 0.7–3.1)
MCH: 30 pg (ref 26.6–33.0)
MCHC: 33.6 g/dL (ref 31.5–35.7)
MCV: 90 fL (ref 79–97)
MONOS ABS: 0.6 10*3/uL (ref 0.1–0.9)
Monocytes: 9 %
NEUTROS PCT: 47 %
Neutrophils Absolute: 3.1 10*3/uL (ref 1.4–7.0)
Platelets: 327 10*3/uL (ref 150–450)
RBC: 4.86 x10E6/uL (ref 3.77–5.28)
RDW: 12.6 % (ref 11.7–15.4)
WBC: 6.7 10*3/uL (ref 3.4–10.8)

## 2018-07-29 LAB — CMP14+EGFR
ALT: 22 IU/L (ref 0–32)
AST: 20 IU/L (ref 0–40)
Albumin/Globulin Ratio: 1.4 (ref 1.2–2.2)
Albumin: 4.1 g/dL (ref 3.8–4.9)
Alkaline Phosphatase: 69 IU/L (ref 39–117)
BUN/Creatinine Ratio: 17 (ref 9–23)
BUN: 13 mg/dL (ref 6–24)
Bilirubin Total: 0.4 mg/dL (ref 0.0–1.2)
CALCIUM: 9.4 mg/dL (ref 8.7–10.2)
CHLORIDE: 103 mmol/L (ref 96–106)
CO2: 22 mmol/L (ref 20–29)
Creatinine, Ser: 0.77 mg/dL (ref 0.57–1.00)
GFR calc Af Amer: 98 mL/min/{1.73_m2} (ref >59)
GFR calc non Af Amer: 85 mL/min/{1.73_m2} (ref >59)
GLUCOSE: 95 mg/dL (ref 65–99)
Globulin, Total: 2.9 g/dL (ref 1.5–4.5)
POTASSIUM: 4.2 mmol/L (ref 3.5–5.2)
Sodium: 140 mmol/L (ref 134–144)
Total Protein: 7 g/dL (ref 6.0–8.5)

## 2018-07-29 LAB — LIPID PANEL
Chol/HDL Ratio: 4.6 ratio — ABNORMAL HIGH (ref 0.0–4.4)
Cholesterol, Total: 181 mg/dL (ref 100–199)
HDL: 39 mg/dL — ABNORMAL LOW (ref >39)
LDL Calculated: 112 mg/dL — ABNORMAL HIGH (ref 0–99)
Triglycerides: 151 mg/dL — ABNORMAL HIGH (ref 0–149)
VLDL CHOLESTEROL CAL: 30 mg/dL (ref 5–40)

## 2018-07-29 LAB — THYROID PANEL WITH TSH
Free Thyroxine Index: 1.6 (ref 1.2–4.9)
T3 Uptake Ratio: 21 % — ABNORMAL LOW (ref 24–39)
T4 TOTAL: 7.7 ug/dL (ref 4.5–12.0)
TSH: 4.92 u[IU]/mL — ABNORMAL HIGH (ref 0.450–4.500)

## 2018-08-02 LAB — IGP, APTIMA HPV, RFX 16/18,45: HPV Aptima: NEGATIVE

## 2018-08-05 DIAGNOSIS — L821 Other seborrheic keratosis: Secondary | ICD-10-CM | POA: Diagnosis not present

## 2018-08-05 DIAGNOSIS — D229 Melanocytic nevi, unspecified: Secondary | ICD-10-CM | POA: Diagnosis not present

## 2018-08-16 ENCOUNTER — Ambulatory Visit: Payer: BLUE CROSS/BLUE SHIELD

## 2018-08-16 DIAGNOSIS — H40053 Ocular hypertension, bilateral: Secondary | ICD-10-CM | POA: Diagnosis not present

## 2018-08-17 ENCOUNTER — Ambulatory Visit
Admission: RE | Admit: 2018-08-17 | Discharge: 2018-08-17 | Disposition: A | Discharge status: Home / Self Care | Payer: BLUE CROSS/BLUE SHIELD | Source: Ambulatory Visit | Attending: Nurse Practitioner | Admitting: Nurse Practitioner

## 2018-08-17 DIAGNOSIS — Z1231 Encounter for screening mammogram for malignant neoplasm of breast: Secondary | ICD-10-CM

## 2018-08-30 ENCOUNTER — Ambulatory Visit: Payer: BLUE CROSS/BLUE SHIELD | Admitting: Family

## 2018-08-30 ENCOUNTER — Other Ambulatory Visit: Payer: Self-pay

## 2018-08-30 ENCOUNTER — Encounter: Payer: Self-pay | Admitting: Family

## 2018-08-30 VITALS — BP 135/72 | HR 86 | Temp 98.7°F | Ht 67.0 in | Wt 172.2 lb

## 2018-08-30 DIAGNOSIS — J301 Allergic rhinitis due to pollen: Secondary | ICD-10-CM

## 2018-08-30 MED ORDER — FLUTICASONE PROPIONATE 50 MCG/ACT NA SUSP: 2.0000 | Freq: Every day | NASAL | 6 refills | Status: DC

## 2018-08-30 MED ORDER — CETIRIZINE HCL 10 MG PO TABS: 10.0000 mg | ORAL_TABLET | Freq: Every day | ORAL | 11 refills | Status: DC

## 2018-08-30 NOTE — Patient Instructions (Signed)
Sinusitis, Adult  Sinusitis is inflammation of your sinuses. Sinuses are hollow spaces in the bones around your face. Your sinuses are located:   Around your eyes.   In the middle of your forehead.   Behind your nose.   In your cheekbones.  Mucus normally drains out of your sinuses. When your nasal tissues become inflamed or swollen, mucus can become trapped or blocked. This allows bacteria, viruses, and fungi to grow, which leads to infection. Most infections of the sinuses are caused by a virus.  Sinusitis can develop quickly. It can last for up to 4 weeks (acute) or for more than 12 weeks (chronic). Sinusitis often develops after a cold.  What are the causes?  This condition is caused by anything that creates swelling in the sinuses or stops mucus from draining. This includes:   Allergies.   Asthma.   Infection from bacteria or viruses.   Deformities or blockages in your nose or sinuses.   Abnormal growths in the nose (nasal polyps).   Pollutants, such as chemicals or irritants in the air.   Infection from fungi (rare).  What increases the risk?  You are more likely to develop this condition if you:   Have a weak body defense system (immune system).   Do a lot of swimming or diving.   Overuse nasal sprays.   Smoke.  What are the signs or symptoms?  The main symptoms of this condition are pain and a feeling of pressure around the affected sinuses. Other symptoms include:   Stuffy nose or congestion.   Thick drainage from your nose.   Swelling and warmth over the affected sinuses.   Headache.   Upper toothache.   A cough that may get worse at night.   Extra mucus that collects in the throat or the back of the nose (postnasal drip).   Decreased sense of smell and taste.   Fatigue.   A fever.   Sore throat.   Bad breath.  How is this diagnosed?  This condition is diagnosed based on:   Your symptoms.   Your medical history.   A physical exam.   Tests to find out if your condition is  acute or chronic. This may include:  ? Checking your nose for nasal polyps.  ? Viewing your sinuses using a device that has a light (endoscope).  ? Testing for allergies or bacteria.  ? Imaging tests, such as an MRI or CT scan.  In rare cases, a bone biopsy may be done to rule out more serious types of fungal sinus disease.  How is this treated?  Treatment for sinusitis depends on the cause and whether your condition is chronic or acute.   If caused by a virus, your symptoms should go away on their own within 10 days. You may be given medicines to relieve symptoms. They include:  ? Medicines that shrink swollen nasal passages (topical intranasal decongestants).  ? Medicines that treat allergies (antihistamines).  ? A spray that eases inflammation of the nostrils (topical intranasal corticosteroids).  ? Rinses that help get rid of thick mucus in your nose (nasal saline washes).   If caused by bacteria, your health care provider may recommend waiting to see if your symptoms improve. Most bacterial infections will get better without antibiotic medicine. You may be given antibiotics if you have:  ? A severe infection.  ? A weak immune system.   If caused by narrow nasal passages or nasal polyps, you may need   to have surgery.  Follow these instructions at home:  Medicines   Take, use, or apply over-the-counter and prescription medicines only as told by your health care provider. These may include nasal sprays.   If you were prescribed an antibiotic medicine, take it as told by your health care provider. Do not stop taking the antibiotic even if you start to feel better.  Hydrate and humidify     Drink enough fluid to keep your urine pale yellow. Staying hydrated will help to thin your mucus.   Use a cool mist humidifier to keep the humidity level in your home above 50%.   Inhale steam for 10-15 minutes, 3-4 times a day, or as told by your health care provider. You can do this in the bathroom while a hot shower is  running.   Limit your exposure to cool or dry air.  Rest   Rest as much as possible.   Sleep with your head raised (elevated).   Make sure you get enough sleep each night.  General instructions     Apply a warm, moist washcloth to your face 3-4 times a day or as told by your health care provider. This will help with discomfort.   Wash your hands often with soap and water to reduce your exposure to germs. If soap and water are not available, use hand sanitizer.   Do not smoke. Avoid being around people who are smoking (secondhand smoke).   Keep all follow-up visits as told by your health care provider. This is important.  Contact a health care provider if:   You have a fever.   Your symptoms get worse.   Your symptoms do not improve within 10 days.  Get help right away if:   You have a severe headache.   You have persistent vomiting.   You have severe pain or swelling around your face or eyes.   You have vision problems.   You develop confusion.   Your neck is stiff.   You have trouble breathing.  Summary   Sinusitis is soreness and inflammation of your sinuses. Sinuses are hollow spaces in the bones around your face.   This condition is caused by nasal tissues that become inflamed or swollen. The swelling traps or blocks the flow of mucus. This allows bacteria, viruses, and fungi to grow, which leads to infection.   If you were prescribed an antibiotic medicine, take it as told by your health care provider. Do not stop taking the antibiotic even if you start to feel better.   Keep all follow-up visits as told by your health care provider. This is important.  This information is not intended to replace advice given to you by your health care provider. Make sure you discuss any questions you have with your health care provider.  Document Released: 06/01/2005 Document Revised: 11/01/2017 Document Reviewed: 11/01/2017  Elsevier Interactive Patient Education  2019 Elsevier Inc.

## 2018-08-30 NOTE — Progress Notes (Signed)
Subjective:    Patient ID: Maria [name], female    DOB: [birth year], 58 y.o.   MRN: [id number]  Chief Complaint  Patient presents with  . Headache    began saturday    Headache   This is a new problem. The current episode started in the past 7 days. The problem occurs intermittently. The problem has been unchanged. The pain is located in the frontal region. The pain does not radiate. The quality of the pain is described as aching. The pain is at a severity of 2/10. The pain is mild. Pertinent negatives include no back pain, blurred vision, coughing, ear pain, facial sweating, fever, hearing loss, loss of balance, numbness, phonophobia, photophobia, rhinorrhea, sinus pressure, sore throat or visual change. The symptoms are aggravated by emotional stress. She has tried nothing for the symptoms. The treatment provided no relief.      Review of Systems  Constitutional: Negative for fever.  HENT: Negative for ear pain, hearing loss, rhinorrhea, sinus pressure and sore throat.   Eyes: Negative for blurred vision and photophobia.  Respiratory: Negative for cough.   Musculoskeletal: Negative for back pain.  Neurological: Positive for headaches. Negative for numbness and loss of balance.  All other systems reviewed and are negative.      Objective:   Physical Exam Vitals signs reviewed.  Constitutional:      General: She is not in acute distress.    Appearance: She is well-developed.  HENT:     Head: Normocephalic and atraumatic.     Right Ear: External ear normal.     Nose: Mucosal edema and rhinorrhea present.     Mouth/Throat:     Pharynx: Posterior oropharyngeal erythema present.  Eyes:     Pupils: Pupils are equal, round, and reactive to light.  Neck:     Musculoskeletal: Normal range of motion and neck supple.     Thyroid: No thyromegaly.  Cardiovascular:     Rate and Rhythm: Normal rate and regular rhythm.     Heart sounds: Normal heart sounds. No murmur.  Pulmonary:   Effort: Pulmonary effort is normal. No respiratory distress.     Breath sounds: Normal breath sounds. No wheezing.  Abdominal:     General: Bowel sounds are normal. There is no distension.     Palpations: Abdomen is soft.     Tenderness: There is no abdominal tenderness.  Musculoskeletal: Normal range of motion.        General: No tenderness.  Skin:    General: Skin is warm and dry.  Neurological:     Mental Status: She is alert and oriented to person, place, and time.     Cranial Nerves: No cranial nerve deficit.     Deep Tendon Reflexes: Reflexes are normal and symmetric.  Psychiatric:        Behavior: Behavior normal.        Thought Content: Thought content normal.        Judgment: Judgment normal.       BP 135/72   Pulse 86   Temp 98.7 F (37.1 C) (Oral)   Ht 5' 7"  (1.702 m)   Wt 172 lb 3.2 oz (78.1 kg)   BMI 26.97 kg/m      Assessment & Plan:  Jillianne Gamino comes in today with chief complaint of Headache (began saturday)   Diagnosis and orders addressed:  1. Allergic rhinitis due to pollen, unspecified seasonality - Take meds as prescribed - Use a cool mist humidifier  -  Use saline nose sprays frequently -Force fluids -For any cough or congestion  Use plain Mucinex- regular strength or max strength is fine -For fever or aces or pains- take tylenol or ibuprofen. -Throat lozenges if help -New toothbrush in 3 days RTO if symp - cetirizine (ZYRTEC) 10 MG tablet; Take 1 tablet (10 mg total) by mouth daily.  Dispense: 30 tablet; Refill: 11 - fluticasone (FLONASE) 50 MCG/ACT nasal spray; Place 2 sprays into both nostrils daily.  Dispense: 16 g; Refill: [place], FNP

## 2018-08-31 ENCOUNTER — Ambulatory Visit: Payer: BLUE CROSS/BLUE SHIELD | Admitting: Pharmacist Clinician (PhC)/ Clinical Pharmacy Specialist

## 2018-09-05 ENCOUNTER — Telehealth: Payer: Self-pay | Admitting: Nurse Practitioner

## 2018-09-05 MED ORDER — SOD FLUORIDE-CA CARBONATE 8.3-364 MG PO TABS: 1.0000 | ORAL_TABLET | Freq: Every day | ORAL | 0 refills | Status: DC

## 2018-09-05 NOTE — Telephone Encounter (Signed)
Calcium refill sent to pharmacy

## 2018-09-07 ENCOUNTER — Ambulatory Visit: Payer: BLUE CROSS/BLUE SHIELD | Admitting: Pharmacist Clinician (PhC)/ Clinical Pharmacy Specialist

## 2018-09-13 ENCOUNTER — Encounter: Payer: Self-pay | Admitting: *Deleted

## 2018-11-18 DIAGNOSIS — H2513 Age-related nuclear cataract, bilateral: Secondary | ICD-10-CM | POA: Diagnosis not present

## 2018-11-18 DIAGNOSIS — H40053 Ocular hypertension, bilateral: Secondary | ICD-10-CM | POA: Diagnosis not present

## 2018-11-18 DIAGNOSIS — H5213 Myopia, bilateral: Secondary | ICD-10-CM | POA: Diagnosis not present

## 2018-12-15 DIAGNOSIS — H5213 Myopia, bilateral: Secondary | ICD-10-CM | POA: Diagnosis not present

## 2019-01-24 ENCOUNTER — Telehealth: Payer: Self-pay | Admitting: *Deleted

## 2019-01-24 NOTE — Telephone Encounter (Addendum)
Prior Auth For Phentermine 37.36m- APPROVED  The member is limited to 12 weeks of therapy per year.  Key:: [id number]  Your information has been submitted to [employer] [place] will review the request and fax you a determination directly, typically within 3 business days of your submission once all necessary information is received.  If [employer] has not responded in 3 business days or if you have any questions about your submission, contact [employer] [phone]

## 2019-01-26 ENCOUNTER — Ambulatory Visit: Payer: BLUE CROSS/BLUE SHIELD | Admitting: Nurse Practitioner

## 2019-01-31 ENCOUNTER — Ambulatory Visit: Payer: BLUE CROSS/BLUE SHIELD | Admitting: Nurse Practitioner

## 2019-02-20 ENCOUNTER — Other Ambulatory Visit: Payer: Self-pay

## 2019-02-20 ENCOUNTER — Emergency Department (HOSPITAL_BASED_OUTPATIENT_CLINIC_OR_DEPARTMENT_OTHER): Payer: BC Managed Care – PPO

## 2019-02-20 ENCOUNTER — Encounter (HOSPITAL_BASED_OUTPATIENT_CLINIC_OR_DEPARTMENT_OTHER): Payer: Self-pay | Admitting: *Deleted

## 2019-02-20 ENCOUNTER — Emergency Department (HOSPITAL_BASED_OUTPATIENT_CLINIC_OR_DEPARTMENT_OTHER)
Admission: EM | Admit: 2019-02-20 | Discharge: 2019-02-20 | Disposition: A | Discharge status: Home / Self Care | Payer: BC Managed Care – PPO | Attending: Emergency Medicine | Admitting: Emergency Medicine

## 2019-02-20 DIAGNOSIS — R079 Chest pain, unspecified: Secondary | ICD-10-CM | POA: Diagnosis not present

## 2019-02-20 DIAGNOSIS — Z79899 Other long term (current) drug therapy: Secondary | ICD-10-CM | POA: Diagnosis not present

## 2019-02-20 DIAGNOSIS — R0789 Other chest pain: Secondary | ICD-10-CM

## 2019-02-20 DIAGNOSIS — R072 Precordial pain: Secondary | ICD-10-CM | POA: Diagnosis not present

## 2019-02-20 DIAGNOSIS — I10 Essential (primary) hypertension: Secondary | ICD-10-CM | POA: Diagnosis not present

## 2019-02-20 LAB — BASIC METABOLIC PANEL
Anion gap: 7 (ref 5–15)
BUN: 13 mg/dL (ref 6–20)
CO2: 27 mmol/L (ref 22–32)
Calcium: 8.6 mg/dL — ABNORMAL LOW (ref 8.9–10.3)
Chloride: 104 mmol/L (ref 98–111)
Creatinine, Ser: 0.53 mg/dL (ref 0.44–1.00)
GFR calc Af Amer: >60 mL/min (ref >60)
GFR calc non Af Amer: >60 mL/min (ref >60)
Glucose, Bld: 85 mg/dL (ref 70–99)
Potassium: 3 mmol/L — ABNORMAL LOW (ref 3.5–5.1)
Sodium: 138 mmol/L (ref 135–145)

## 2019-02-20 LAB — CBC
HCT: 42.7 % (ref 36.0–46.0)
Hemoglobin: 14 g/dL (ref 12.0–15.0)
MCH: 30 pg (ref 26.0–34.0)
MCHC: 32.8 g/dL (ref 30.0–36.0)
MCV: 91.6 fL (ref 80.0–100.0)
Platelets: 301 10*3/uL (ref 150–400)
RBC: 4.66 MIL/uL (ref 3.87–5.11)
RDW: 12.9 % (ref 11.5–15.5)
WBC: 8.9 10*3/uL (ref 4.0–10.5)
nRBC: 0 % (ref 0.0–0.2)

## 2019-02-20 LAB — TROPONIN I (HIGH SENSITIVITY)
Troponin I (High Sensitivity): 2 ng/L (ref <18)
Troponin I (High Sensitivity): 2 ng/L (ref <18)

## 2019-02-20 MED ORDER — POTASSIUM CHLORIDE CRYS ER 20 MEQ PO TBCR
40.0000 meq | EXTENDED_RELEASE_TABLET | Freq: Once | ORAL | Status: AC
  Administered 2019-02-20: 40 meq via ORAL
  Filled 2019-02-20: qty 2

## 2019-02-20 NOTE — ED Provider Notes (Signed)
[facility] EMERGENCY DEPARTMENT Provider Note   CSN: [id number] Arrival date & time: 02/20/19  1522     History   Chief Complaint Chief Complaint  Patient presents with  . Chest Pain    HPI Maria [name] is a 58 y.o. female with past medical history of GERD, celiac disease, hyperlipidemia, presenting to the emergency department with complaint of intermittent episodes of central chest discomfort that has been coming and going for multiple weeks.  She states the discomfort feels like a tightness in her central chest lasting about 30 seconds in duration at a time.  It is not exertional.  She has no associated radiation of pain, nausea, vomiting, diaphoresis.  She states she has had increased stress level at work.  She has no known heart disease.  She states she takes medications for leg swelling, however has never had high blood pressure.     The history is provided by the patient.    Past Medical History:  Diagnosis Date  . Atypical nevus 10/25/2003   left pinky toe  . Atypical nevus 05/03/2018   left outer breast-moderate  . BCC (basal cell carcinoma) superficial 05/03/2018   right shoulder superior  . Celiac disease   . Family history of malignant neoplasm of gastrointestinal tract   . Hyperlipidemia   . Personal history of colonic polyps 07/27/2008   TUBULAR ADENOMA  . SCC (squamous cell carcinoma) in situ x 2 05/03/2018   left chest, left breast inner    Patient Active Problem List   Diagnosis Date Noted  . Osteoporosis 09/20/2015  . BMI 27.0-27.9,adult 09/10/2015  . Essential hypertension 07/19/2014  . Hyperlipidemia with target LDL less than 100 07/19/2014  . Iron deficiency anemia 07/23/2008  . EXTERNAL HEMORRHOIDS 07/23/2008  . GERD 07/23/2008  . CELIAC DISEASE 07/23/2008  . COLONIC POLYPS, HX OF 07/23/2008    Past Surgical History:  Procedure Laterality Date  . INNER EAR SURGERY    . TUBAL LIGATION       OB History   No obstetric history on  file.      Home Medications    Prior to Admission medications   Medication Sig Start Date End Date Taking? Authorizing Provider  cholecalciferol (VITAMIN D) 1000 units tablet Take 2 tablets (2,000 Units total) by mouth daily. 09/20/15   Cherre Robins, PharmD  Cyanocobalamin (VITAMIN B 12 PO) Take by mouth.    [provider]  triamterene-hydrochlorothiazide (MAXZIDE) 75-50 MG tablet Take 1 tablet by mouth daily. 07/28/18   Chevis Pretty, FNP    Family History Family History  Problem Relation Age of Onset  . Colon cancer Father 70  . COPD Mother   . Heart disease Mother        MI  . Diabetes Mother   . Osteoporosis Sister   . Cancer Paternal Aunt        breast  . Breast cancer Maternal Grandmother   . Esophageal cancer Neg Hx   . Rectal cancer Neg Hx   . Stomach cancer Neg Hx     Social History Social History   Tobacco Use  . Smoking status: Never Smoker  . Smokeless tobacco: Never Used  Substance Use Topics  . Alcohol use: No  . Drug use: No     Allergies   Claritin [loratadine]   Review of Systems Review of Systems  Constitutional: Negative for diaphoresis and fever.  Respiratory: Negative for cough and shortness of breath.   Cardiovascular: Positive for chest pain.  Negative for palpitations and leg swelling.  Gastrointestinal: Negative for abdominal pain, nausea and vomiting.  All other systems reviewed and are negative.    Physical Exam Updated Vital Signs BP 115/67 (BP Location: Left Arm)   Pulse 75   Temp 98.7 F (37.1 C) (Oral)   Resp 16   Ht 5' 7"  (1.702 m)   Wt 77.1 kg   SpO2 97%   BMI 26.63 kg/m   Physical Exam Vitals signs and nursing note reviewed.  Constitutional:      General: She is not in acute distress.    Appearance: She is well-developed. She is not ill-appearing.  HENT:     Head: Normocephalic and atraumatic.  Eyes:     Conjunctiva/sclera: Conjunctivae normal.  Cardiovascular:     Rate and Rhythm: Normal  rate and regular rhythm.  Pulmonary:     Effort: Pulmonary effort is normal. No respiratory distress.     Breath sounds: Normal breath sounds.  Chest:     Chest wall: No tenderness.  Abdominal:     Palpations: Abdomen is soft.     Tenderness: There is no abdominal tenderness. There is no guarding or rebound.  Musculoskeletal:     Comments: Trace bilateral lower extremity edema.  No redness or warmth, no tenderness.  Skin:    General: Skin is warm.  Neurological:     Mental Status: She is alert.  Psychiatric:        Behavior: Behavior normal.      ED Treatments / Results  Labs (all labs ordered are listed, but only abnormal results are displayed) Labs Reviewed  BASIC METABOLIC PANEL - Abnormal; Notable for the following components:      Result Value   Potassium 3.0 (*)    Calcium 8.6 (*)    All other components within normal limits  CBC  TROPONIN I (HIGH SENSITIVITY)  TROPONIN I (HIGH SENSITIVITY)    EKG EKG Interpretation  Date/Time:  Monday February 20 2019 15:31:58 EDT Ventricular Rate:  82 PR Interval:  140 QRS Duration: 90 QT Interval:  384 QTC Calculation: 448 R Axis:   46 Text Interpretation:  Sinus rhythm with sinus arrhythmia with occasional Premature ventricular complexes Otherwise normal ECG No old tracing to compare Confirmed by Malvin Johns [phone]) on 02/20/2019 6:07:26 PM   Radiology Dg Chest 2 View  Result Date: 02/20/2019 CLINICAL DATA:  Intermittent substernal chest pain for several weeks. EXAM: CHEST - 2 VIEW COMPARISON:  09/10/2015 FINDINGS: Normal sized heart. Clear lungs. Minimal diffuse peribronchial thickening. No acute bony abnormality. IMPRESSION: Minimal bronchitic changes. Electronically Signed   By: Claudie Revering M.D.   On: 02/20/2019 15:42    Procedures Procedures (including critical care time)  Medications Ordered in ED Medications  potassium chloride SA (K-DUR) CR tablet 40 mEq (40 mEq Oral Given 02/20/19 1859)     Initial  Impression / Assessment and Plan / ED Course  I have reviewed the triage vital signs and the nursing notes.  Pertinent labs & imaging results that were available during my care of the patient were reviewed by me and considered in my medical decision making (see chart for details).        Patient presenting with intermittent brief episodes of chest discomfort over the last several weeks.  No known cardiac history.  Symptoms are not exertional and are atypical. No respiratory or abdominal complaints.  Her work-up today is reassuring, no leukocytosis.  Mild hypokalemia, replaced orally in the ED. Troponin level is  2 and unchanged after recheck.  EKG without ischemic changes.  Chest x-ray without infiltrate. Recommend patient trial antacids for her symptoms given her history of GERD and mild similarity to this.  She is relatively low heart score, and she has good close PCP follow-up.  I believe patient is safe for discharge with outpatient follow-up and strict return precautions.  She is agreeable to plan and safe for discharge.  Discussed results, findings, treatment and follow up. Patient advised of return precautions. Patient verbalized understanding and agreed with plan.  Final Clinical Impressions(s) / ED Diagnoses   Final diagnoses:  Chest discomfort    ED Discharge Orders    None       Josecarlos Harriott, [place] N, PA-C 02/20/19 2002    Malvin Johns, MD 02/20/19 2258

## 2019-02-20 NOTE — ED Triage Notes (Signed)
Substernal intermittent CP x several weeks. Denies SOB, N/V, diaphoresis.

## 2019-02-20 NOTE — Discharge Instructions (Signed)
Please read instructions below. Follow up with your primary care provider this week.  Return to the ER for new or worsening symptoms; including worsening chest pain, shortness of breath, pain that radiates to the arm or neck, pain or shortness of breath worsened with exertion.

## 2019-02-23 ENCOUNTER — Encounter: Payer: Self-pay | Admitting: Nurse Practitioner

## 2019-02-23 ENCOUNTER — Ambulatory Visit (INDEPENDENT_AMBULATORY_CARE_PROVIDER_SITE_OTHER): Payer: BC Managed Care – PPO | Admitting: Nurse Practitioner

## 2019-02-23 DIAGNOSIS — E785 Hyperlipidemia, unspecified: Secondary | ICD-10-CM | POA: Diagnosis not present

## 2019-02-23 DIAGNOSIS — R609 Edema, unspecified: Secondary | ICD-10-CM

## 2019-02-23 DIAGNOSIS — I10 Essential (primary) hypertension: Secondary | ICD-10-CM | POA: Diagnosis not present

## 2019-02-23 DIAGNOSIS — M810 Age-related osteoporosis without current pathological fracture: Secondary | ICD-10-CM

## 2019-02-23 DIAGNOSIS — K219 Gastro-esophageal reflux disease without esophagitis: Secondary | ICD-10-CM | POA: Diagnosis not present

## 2019-02-23 DIAGNOSIS — D509 Iron deficiency anemia, unspecified: Secondary | ICD-10-CM

## 2019-02-23 DIAGNOSIS — Z6827 Body mass index (BMI) 27.0-27.9, adult: Secondary | ICD-10-CM

## 2019-02-23 DIAGNOSIS — K900 Celiac disease: Secondary | ICD-10-CM

## 2019-02-23 MED ORDER — FUROSEMIDE 20 MG PO TABS: 10.0000 mg | ORAL_TABLET | Freq: Every day | ORAL | 3 refills | Status: DC

## 2019-02-23 NOTE — Progress Notes (Signed)
Virtual Visit via telephone Note Due to COVID-19 pandemic this visit was conducted virtually. This visit type was conducted due to national recommendations for restrictions regarding the COVID-19 Pandemic (e.g. social distancing, sheltering in place) in an effort to limit this patient's exposure and mitigate transmission in our community. All issues noted in this document were discussed and addressed.  A physical exam was not performed with this format.  I connected with Maria [name] on 02/23/19 at 2:55 by telephone and verified that I am speaking with the correct person using two identifiers. Maria [name] is currently located at home and no one is currently with  her during visit. The provider, Mary-Margaret Hassell Done, FNP is located in their office at time of visit.  I discussed the limitations, risks, security and privacy concerns of performing an evaluation and management service by telephone and the availability of in person appointments. I also discussed with the patient that there may be a patient responsible charge related to this service. The patient expressed understanding and agreed to proceed.   History and Present Illness:   Chief Complaint: Medical Management of Chronic Issues    HPI:  1. Essential hypertension No c/o chest pain, sob or headache. Does not check blood pressure at home. Has on ly been taking a 1/4 of her maxide daily. If she skips it all together then her legs will swell. BP Readings from Last 3 Encounters:  02/20/19 (!) 114/54  08/30/18 135/72  07/28/18 99/68     2. Hyperlipidemia with target LDL less than 100 Trying to watch diet. Stays active but does not do any exercise. Lab Results  Component Value Date   CHOL 181 07/28/2018   HDL 39 (L) 07/28/2018   LDLCALC 112 (H) 07/28/2018   TRIG 151 (H) 07/28/2018   CHOLHDL 4.6 (H) 07/28/2018     3. Iron deficiency anemia, unspecified iron deficiency anemia type No c/o fatigue Lab Results  Component Value  Date   HGB 14.0 02/20/2019     4. Gastroesophageal reflux disease without esophagitis Is doing well  5. Celiac disease Had flare up 2 weeks ago but is okay now  6. Osteoporosis without current pathological fracture, unspecified osteoporosis type Last dexascan was done 07/28/18 with t score of -2.6. taking multivitamin daily  7. BMI 27.0-27.9,adult No recent weight chnages    Outpatient Encounter Medications as of 02/23/2019  Medication Sig  . cholecalciferol (VITAMIN D) 1000 units tablet Take 2 tablets (2,000 Units total) by mouth daily.  . Cyanocobalamin (VITAMIN B 12 PO) Take by mouth.  . triamterene-hydrochlorothiazide (MAXZIDE) 75-50 MG tablet Take 1 tablet by mouth daily.     Past Surgical History:  Procedure Laterality Date  . INNER EAR SURGERY    . TUBAL LIGATION      Family History  Problem Relation Age of Onset  . Colon cancer Father 42  . COPD Mother   . Heart disease Mother        MI  . Diabetes Mother   . Osteoporosis Sister   . Cancer Paternal Aunt        breast  . Breast cancer Maternal Grandmother   . Esophageal cancer Neg Hx   . Rectal cancer Neg Hx   . Stomach cancer Neg Hx     New complaints: None today  Social history: Lives with her husband  Controlled substance contract: n/a    Review of Systems  Constitutional: Negative for diaphoresis and weight loss.  Eyes: Negative for blurred vision, double  vision and pain.  Respiratory: Negative for shortness of breath.   Cardiovascular: Negative for chest pain, palpitations, orthopnea and leg swelling.  Gastrointestinal: Negative for abdominal pain.  Skin: Negative for rash.  Neurological: Negative for dizziness, sensory change, loss of consciousness, weakness and headaches.  Endo/Heme/Allergies: Negative for polydipsia. Does not bruise/bleed easily.  Psychiatric/Behavioral: Negative for memory loss. The patient does not have insomnia.   All other systems reviewed and are negative.     Observations/Objective: Alert and oriented- answers all questions appropriately No distress    Assessment and Plan: [name] comes in today with chief complaint of Medical Management of Chronic Issues   Diagnosis and orders addressed:  1. Essential hypertension Low sodium diet - CMP14+EGFR  2. Hyperlipidemia with target LDL less than 100 Low fat diet - Lipid panel  3. Iron deficiency anemia, unspecified iron deficiency anemia type - CBC with Differential/Platelet  4. Gastroesophageal reflux disease without esophagitis Avoid spicy foods Do not eat 2 hours prior to bedtime  5. Celiac disease Watch diet- keep diary so when has next flareup you can figure out what was causing it.  6. Osteoporosis without current pathological fracture, unspecified osteoporosis type Weight bearing exercise encouraged  7. BMI 27.0-27.9,adult Discussed diet and exercise for person with BMI >25 Will recheck weight in 3-6 months  8. Peripheral edema Elevate legs when sitting - furosemide (LASIX) 20 MG tablet; Take 0.5-1 tablets (10-20 mg total) by mouth daily.  Dispense: 30 tablet; Refill: 3   Previous labs reviewed- patient want sto come in to hav elabs drawn Health Maintenance reviewed Diet and exercise encouraged  Follow up plan: 6 months      I discussed the assessment and treatment plan with the patient. The patient was provided an opportunity to ask questions and all were answered. The patient agreed with the plan and demonstrated an understanding of the instructions.   The patient was advised to call back or seek an in-person evaluation if the symptoms worsen or if the condition fails to improve as anticipated.  The above assessment and management plan was discussed with the patient. The patient verbalized understanding of and has agreed to the management plan. Patient is aware to call the clinic if symptoms persist or worsen. Patient is aware when to return to the clinic for  a follow-up visit. Patient educated on when it is appropriate to go to the emergency department.   Time call ended:  3:10  I provided 15 minutes of non-face-to-face time during this encounter.    Mary-Margaret Hassell Done, FNP

## 2019-02-24 ENCOUNTER — Other Ambulatory Visit: Payer: BC Managed Care – PPO

## 2019-02-24 ENCOUNTER — Other Ambulatory Visit: Payer: Self-pay

## 2019-02-25 LAB — CMP14+EGFR
ALT: 29 IU/L (ref 0–32)
AST: 22 IU/L (ref 0–40)
Albumin/Globulin Ratio: 2 (ref 1.2–2.2)
Albumin: 4.3 g/dL (ref 3.8–4.9)
Alkaline Phosphatase: 91 IU/L (ref 39–117)
BUN/Creatinine Ratio: 22 (ref 9–23)
BUN: 13 mg/dL (ref 6–24)
Bilirubin Total: 0.3 mg/dL (ref 0.0–1.2)
CO2: 26 mmol/L (ref 20–29)
Calcium: 9.1 mg/dL (ref 8.7–10.2)
Chloride: 103 mmol/L (ref 96–106)
Creatinine, Ser: 0.59 mg/dL (ref 0.57–1.00)
GFR calc Af Amer: 116 mL/min/{1.73_m2} (ref >59)
GFR calc non Af Amer: 101 mL/min/{1.73_m2} (ref >59)
Globulin, Total: 2.2 g/dL (ref 1.5–4.5)
Glucose: 93 mg/dL (ref 65–99)
Potassium: 3.9 mmol/L (ref 3.5–5.2)
Sodium: 140 mmol/L (ref 134–144)
Total Protein: 6.5 g/dL (ref 6.0–8.5)

## 2019-02-25 LAB — LIPID PANEL
Chol/HDL Ratio: 4.6 ratio — ABNORMAL HIGH (ref 0.0–4.4)
Cholesterol, Total: 158 mg/dL (ref 100–199)
HDL: 34 mg/dL — ABNORMAL LOW (ref >39)
LDL Chol Calc (NIH): 99 mg/dL (ref 0–99)
Triglycerides: 139 mg/dL (ref 0–149)
VLDL Cholesterol Cal: 25 mg/dL (ref 5–40)

## 2019-02-25 LAB — CBC WITH DIFFERENTIAL/PLATELET
Basophils Absolute: 0.1 10*3/uL (ref 0.0–0.2)
Basos: 1 %
EOS (ABSOLUTE): 0.4 10*3/uL (ref 0.0–0.4)
Eos: 5 %
Hematocrit: 42.2 % (ref 34.0–46.6)
Hemoglobin: 14.4 g/dL (ref 11.1–15.9)
Immature Grans (Abs): 0 10*3/uL (ref 0.0–0.1)
Immature Granulocytes: 0 %
Lymphocytes Absolute: 3 10*3/uL (ref 0.7–3.1)
Lymphs: 36 %
MCH: 30.5 pg (ref 26.6–33.0)
MCHC: 34.1 g/dL (ref 31.5–35.7)
MCV: 89 fL (ref 79–97)
Monocytes Absolute: 0.9 10*3/uL (ref 0.1–0.9)
Monocytes: 10 %
Neutrophils Absolute: 4.2 10*3/uL (ref 1.4–7.0)
Neutrophils: 48 %
Platelets: 306 10*3/uL (ref 150–450)
RBC: 4.72 x10E6/uL (ref 3.77–5.28)
RDW: 12.3 % (ref 11.7–15.4)
WBC: 8.5 10*3/uL (ref 3.4–10.8)

## 2019-03-24 ENCOUNTER — Ambulatory Visit: Payer: BC Managed Care – PPO | Admitting: Family Medicine

## 2019-03-24 ENCOUNTER — Other Ambulatory Visit: Payer: Self-pay

## 2019-03-24 ENCOUNTER — Encounter: Payer: Self-pay | Admitting: Family Medicine

## 2019-03-24 ENCOUNTER — Ambulatory Visit (INDEPENDENT_AMBULATORY_CARE_PROVIDER_SITE_OTHER): Payer: BC Managed Care – PPO

## 2019-03-24 VITALS — BP 104/66 | HR 75 | Temp 98.2°F | Resp 20 | Ht 67.0 in | Wt 172.0 lb

## 2019-03-24 DIAGNOSIS — G8929 Other chronic pain: Secondary | ICD-10-CM

## 2019-03-24 DIAGNOSIS — M25511 Pain in right shoulder: Secondary | ICD-10-CM | POA: Diagnosis not present

## 2019-03-24 MED ORDER — BETAMETHASONE SOD PHOS & ACET 6 (3-3) MG/ML IJ SUSP: 6.0000 mg | Freq: Once | INTRAMUSCULAR | Status: DC

## 2019-03-24 MED ORDER — DICLOFENAC SODIUM 75 MG PO TBEC: 75.0000 mg | DELAYED_RELEASE_TABLET | Freq: Two times a day (BID) | ORAL | 2 refills | Status: DC

## 2019-03-24 NOTE — Progress Notes (Signed)
Chief Complaint  Patient presents with  . Shoulder Pain    Hurting for months    HPI  Patient presents today for right shoulder pain gradually worsening for several months. Hurts anterolaterally with abduction above 75 degrees and with flexion over 75 degrees. Minimal with rotation. Pain 3-4/10 in the AM but climbs to 6/10 by the evening daily. Pain is an ache at rest. Sharp with abduction and flexion. NKI. Side sleeper. Hurts to lay on that side. Minimal relief with Ibuprofen.   PMH: Smoking status noted ROS: Per HPI  Objective: BP 104/66   Pulse 75   Temp 98.2 F (36.8 C) (Temporal)   Resp 20   Ht 5' 7"  (1.702 m)   Wt 172 lb (78 kg)   SpO2 96%   BMI 26.94 kg/m  Gen: NAD, alert, cooperative with exam HEENT: NCAT, EOMI, PERRL Ext: No edema, warm. FROM RUE. Painful for resisted abduction especially beyond 90 degrees. MIld diffuse tenderness for palpation over deltoid and AC area. FROM,  NV intact for RUE.  Neuro: Alert and oriented, No gross deficits XR- nml joint space, good alignment. Nothing acute Preliminary reading done by Randell Loop  Assessment and plan:  1. Chronic right shoulder pain     No orders of the defined types were placed in this encounter.   Orders Placed This Encounter  Procedures  . DG Shoulder Right    Standing Status:   Future    Number of Occurrences:   1    Standing Expiration Date:   05/23/2020    Order Specific Question:   Reason for Exam (SYMPTOM  OR DIAGNOSIS REQUIRED)    Answer:   pain right shoulder. NKI    Order Specific Question:   Is the patient pregnant?    Answer:   No    Order Specific Question:   Preferred imaging location?    Answer:   Internal    Follow up as needed.  Claretta Fraise, MD

## 2019-04-16 DIAGNOSIS — S0101XA Laceration without foreign body of scalp, initial encounter: Secondary | ICD-10-CM | POA: Diagnosis not present

## 2019-04-24 ENCOUNTER — Telehealth: Payer: Self-pay | Admitting: Nurse Practitioner

## 2019-04-24 DIAGNOSIS — G8929 Other chronic pain: Secondary | ICD-10-CM

## 2019-04-24 NOTE — Telephone Encounter (Signed)
Ok for ortho referral- please find out where she would like to go

## 2019-04-24 NOTE — Telephone Encounter (Signed)
Patient does not have a preference contact number [phone]

## 2019-06-15 DIAGNOSIS — R42 Dizziness and giddiness: Secondary | ICD-10-CM | POA: Diagnosis not present

## 2019-07-05 DIAGNOSIS — H5213 Myopia, bilateral: Secondary | ICD-10-CM | POA: Diagnosis not present

## 2019-07-20 ENCOUNTER — Ambulatory Visit (INDEPENDENT_AMBULATORY_CARE_PROVIDER_SITE_OTHER): Payer: BC Managed Care – PPO | Admitting: Nurse Practitioner

## 2019-07-20 ENCOUNTER — Encounter: Payer: Self-pay | Admitting: Nurse Practitioner

## 2019-07-20 DIAGNOSIS — J0100 Acute maxillary sinusitis, unspecified: Secondary | ICD-10-CM | POA: Diagnosis not present

## 2019-07-20 MED ORDER — AMOXICILLIN-POT CLAVULANATE 875-125 MG PO TABS: 1.0000 | ORAL_TABLET | Freq: Two times a day (BID) | ORAL | 0 refills | Status: DC

## 2019-07-20 NOTE — Progress Notes (Signed)
Virtual Visit via telephone Note Due to COVID-19 pandemic this visit was conducted virtually. This visit type was conducted due to national recommendations for restrictions regarding the COVID-19 Pandemic (e.g. social distancing, sheltering in place) in an effort to limit this patient's exposure and mitigate transmission in our community. All issues noted in this document were discussed and addressed.  A physical exam was not performed with this format.  I connected with Maria [name] on 07/20/19 at 2:50 by telephone and verified that I am speaking with the correct person using two identifiers. Maria [name] is currently located at home and no one  is currently with her during visit. The provider, Maria Hassell Done, FNP is located in their office at time of visit.  I discussed the limitations, risks, security and privacy concerns of performing an evaluation and management service by telephone and the availability of in person appointments. I also discussed with the patient that there may be a patient responsible charge related to this service. The patient expressed understanding and agreed to proceed.   History and Present Illness:   Chief Complaint: Sinusitis   HPI Patient calls in today for appointment c/o burning sensation in nose and dry cough that started yesterday. She denies any fever or body aches. Is very fatigued.  Review of Systems  Constitutional: Negative for chills and fever.  HENT: Positive for congestion. Negative for sinus pain and sore throat.   Respiratory: Positive for cough.   Cardiovascular: Negative.   Musculoskeletal: Negative.   Neurological: Positive for headaches.  All other systems reviewed and are negative.    Observations/Objective: Alert and oriented- answers all questions appropriately No distress    Assessment and Plan: Maria [name] in today with chief complaint of Sinusitis   1. Acute non-recurrent maxillary sinusitis 1. Take meds as  prescribed 2. Use a cool mist humidifier especially during the winter months and when heat has been humid. 3. Use saline nose sprays frequently 4. Saline irrigations of the nose can be very helpful if [name] frequently.  * 4X daily for 1 week*  * Use of a nettie pot can be helpful with this. Follow directions with this* 5. Drink plenty of fluids 6. Keep thermostat turn down low 7.For any cough or congestion  Use plain Mucinex- regular strength or max strength is fine   * Children- consult with Pharmacist for dosing 8. For fever or aces or pains- take tylenol or ibuprofen appropriate for age and weight.  * for fevers greater than 101 orally you may alternate ibuprofen and tylenol every  3 hours.    - amoxicillin-clavulanate (AUGMENTIN) 875-125 MG tablet; Take 1 tablet by mouth 2 (two) times daily.  Dispense: 14 tablet; Refill: 0    Follow Up Instructions: Prn     I discussed the assessment and treatment plan with the patient. The patient was provided an opportunity to ask questions and all were answered. The patient agreed with the plan and demonstrated an understanding of the instructions.   The patient was advised to call back or seek an in-person evaluation if the symptoms worsen or if the condition fails to improve as anticipated.  The above assessment and management plan was discussed with the patient. The patient verbalized understanding of and has agreed to the management plan. Patient is aware to call the clinic if symptoms persist or worsen. Patient is aware when to return to the clinic for a follow-up visit. Patient educated on when it is appropriate to go to the emergency department.  Time call ended:  3:04  I provided 14 minutes of non-face-to-face time during this encounter.    Maria Hassell Done, FNP

## 2019-07-21 ENCOUNTER — Other Ambulatory Visit: Payer: Self-pay

## 2019-07-21 ENCOUNTER — Ambulatory Visit: Payer: BC Managed Care – PPO | Attending: Internal Medicine

## 2019-07-21 DIAGNOSIS — Z20822 Contact with and (suspected) exposure to covid-19: Secondary | ICD-10-CM | POA: Diagnosis not present

## 2019-07-23 LAB — NOVEL CORONAVIRUS, NAA: SARS-CoV-2, NAA: DETECTED — AB

## 2019-07-24 ENCOUNTER — Other Ambulatory Visit: Payer: Self-pay | Admitting: Internal Medicine

## 2019-07-24 ENCOUNTER — Ambulatory Visit (HOSPITAL_COMMUNITY)
Admission: RE | Admit: 2019-07-24 | Discharge: 2019-07-24 | Disposition: A | Discharge status: Home / Self Care | Payer: BC Managed Care – PPO | Source: Ambulatory Visit | Attending: Pulmonary Disease | Admitting: Pulmonary Disease

## 2019-07-24 DIAGNOSIS — I10 Essential (primary) hypertension: Secondary | ICD-10-CM

## 2019-07-24 DIAGNOSIS — U071 COVID-19: Secondary | ICD-10-CM

## 2019-07-24 DIAGNOSIS — Z23 Encounter for immunization: Secondary | ICD-10-CM | POA: Diagnosis not present

## 2019-07-24 MED ORDER — METHYLPREDNISOLONE SODIUM SUCC 125 MG IJ SOLR: 125.0000 mg | Freq: Once | INTRAMUSCULAR | Status: DC | PRN

## 2019-07-24 MED ORDER — FAMOTIDINE IN NACL 20-0.9 MG/50ML-% IV SOLN: 20.0000 mg | Freq: Once | INTRAVENOUS | Status: DC | PRN

## 2019-07-24 MED ORDER — SODIUM CHLORIDE 0.9 % IV SOLN
INTRAVENOUS | Status: DC | PRN
  Administered 2019-07-24: 250 mL via INTRAVENOUS

## 2019-07-24 MED ORDER — SODIUM CHLORIDE 0.9 % IV SOLN
700.0000 mg | Freq: Once | INTRAVENOUS | Status: AC
  Administered 2019-07-24: 700 mg via INTRAVENOUS
  Filled 2019-07-24: qty 20

## 2019-07-24 MED ORDER — DIPHENHYDRAMINE HCL 50 MG/ML IJ SOLN: 50.0000 mg | Freq: Once | INTRAMUSCULAR | Status: DC | PRN

## 2019-07-24 MED ORDER — ALBUTEROL SULFATE HFA 108 (90 BASE) MCG/ACT IN AERS: 2.0000 | INHALATION_SPRAY | Freq: Once | RESPIRATORY_TRACT | Status: DC | PRN

## 2019-07-24 MED ORDER — EPINEPHRINE 0.3 MG/0.3ML IJ SOAJ: 0.3000 mg | Freq: Once | INTRAMUSCULAR | Status: DC | PRN

## 2019-07-24 NOTE — Progress Notes (Signed)
  Diagnosis: COVID-19  Physician: Joya Gaskins  Procedure: [facility] Med: bamlanivimab infusion - Provided patient with bamlanimivab fact sheet for patients, parents and caregivers prior to infusion.  Complications: No immediate complications noted.  Discharge: Discharged home   Maria [name], Maria [name] 07/24/2019

## 2019-07-24 NOTE — Progress Notes (Signed)
  I connected by phone with Maria [name] on 07/24/2019 at 9:24 AM to discuss the potential use of an new treatment for mild to moderate COVID-19 viral infection in non-hospitalized patients.  This patient is a 59 y.o. female that meets the FDA criteria for Emergency Use Authorization of bamlanivimab or casirivimab\imdevimab.  Has a (+) direct SARS-CoV-2 viral test result  Has mild or moderate COVID-19   Is ? 60 years of age and weighs ? 40 kg  Is NOT hospitalized due to COVID-19  Is NOT requiring oxygen therapy or requiring an increase in baseline oxygen flow rate due to COVID-19  Is within 10 days of symptom onset  Has at least one of the high risk factor(s) for progression to severe COVID-19 and/or hospitalization as defined in EUA. Specific high risk criteria :>55yo c hx of htn  I have spoken and communicated the following to the patient or parent/caregiver:  1. FDA has authorized the emergency use of bamlanivimab and casirivimab\imdevimab for the treatment of mild to moderate COVID-19 in adults and pediatric patients with positive results of direct SARS-CoV-2 viral testing who are 69 years of age and older weighing at least 40 kg, and who are at high risk for progressing to severe COVID-19 and/or hospitalization.  2. The significant known and potential risks and benefits of bamlanivimab and casirivimab\imdevimab, and the extent to which such potential risks and benefits are unknown.  3. Information on available alternative treatments and the risks and benefits of those alternatives, including clinical trials.  4. Patients treated with bamlanivimab and casirivimab\imdevimab should continue to self-isolate and use infection control measures (e.g., wear mask, isolate, social distance, avoid sharing personal items, clean and disinfect "high touch" surfaces, and frequent handwashing) according to CDC guidelines.   5. The patient or parent/caregiver has the option to accept or refuse  bamlanivimab or casirivimab\imdevimab .  After reviewing this information with the patient, The patient agreed to proceed with receiving the bamlanimivab infusion and will be provided a copy of the Fact sheet prior to receiving the infusion.   Infusion scheduled for 2/8 at 1430.  Alan Ripper, NP-C [facility] [place]  pgr [phone]

## 2019-07-24 NOTE — Discharge Instructions (Signed)

## 2020-01-19 ENCOUNTER — Encounter: Payer: Self-pay | Admitting: Nurse Practitioner

## 2020-01-19 ENCOUNTER — Ambulatory Visit (INDEPENDENT_AMBULATORY_CARE_PROVIDER_SITE_OTHER): Payer: BC Managed Care – PPO | Admitting: Nurse Practitioner

## 2020-01-19 DIAGNOSIS — I10 Essential (primary) hypertension: Secondary | ICD-10-CM

## 2020-01-19 DIAGNOSIS — K644 Residual hemorrhoidal skin tags: Secondary | ICD-10-CM

## 2020-01-19 DIAGNOSIS — K900 Celiac disease: Secondary | ICD-10-CM | POA: Diagnosis not present

## 2020-01-19 DIAGNOSIS — M810 Age-related osteoporosis without current pathological fracture: Secondary | ICD-10-CM

## 2020-01-19 DIAGNOSIS — E785 Hyperlipidemia, unspecified: Secondary | ICD-10-CM | POA: Diagnosis not present

## 2020-01-19 DIAGNOSIS — Z6827 Body mass index (BMI) 27.0-27.9, adult: Secondary | ICD-10-CM

## 2020-01-19 DIAGNOSIS — D509 Iron deficiency anemia, unspecified: Secondary | ICD-10-CM

## 2020-01-19 DIAGNOSIS — R609 Edema, unspecified: Secondary | ICD-10-CM

## 2020-01-19 DIAGNOSIS — K219 Gastro-esophageal reflux disease without esophagitis: Secondary | ICD-10-CM

## 2020-01-19 MED ORDER — FUROSEMIDE 20 MG PO TABS: 10.0000 mg | ORAL_TABLET | Freq: Every day | ORAL | 3 refills | Status: DC

## 2020-01-19 MED ORDER — TRIAMTERENE-HCTZ 75-50 MG PO TABS: 1.0000 | ORAL_TABLET | Freq: Every day | ORAL | 1 refills | Status: DC

## 2020-01-19 NOTE — Progress Notes (Signed)
Virtual Visit via telephone Note Due to COVID-19 pandemic this visit was conducted virtually. This visit type was conducted due to national recommendations for restrictions regarding the COVID-19 Pandemic (e.g. social distancing, sheltering in place) in an effort to limit this patient's exposure and mitigate transmission in our community. All issues noted in this document were discussed and addressed.  A physical exam was not performed with this format.  I connected with Maria [name] on 01/19/20 at 10:40 by telephone and verified that I am speaking with the correct person using two identifiers. Maria [name] is currently located at home and no one is currently with  her during visit. The provider, Tresa Garter, RN is located in their office at time of visit.  I discussed the limitations, risks, security and privacy concerns of performing an evaluation and management service by telephone and the availability of in person appointments. I also discussed with the patient that there may be a patient responsible charge related to this service. The patient expressed understanding and agreed to proceed.   History and Present Illness:   Chief Complaint: Medical Management of Chronic Issues    HPI:  1. Essential hypertension Patient reports she does not check it often at home. She does not restrict sodium in her diet.  BP Readings from Last 3 Encounters:  07/24/19 125/62  03/24/19 104/66  02/20/19 (!) 114/54     2. Celiac disease Is avoiding gluten in her diet and is having no problems at this time.   3. Hyperlipidemia with target LDL less than 100 Does make efforts to restrict dietary fat. Does not exercise. Lab Results  Component Value Date   CHOL 158 02/24/2019   HDL 34 (L) 02/24/2019   LDLCALC 99 02/24/2019   TRIG 139 02/24/2019   CHOLHDL 4.6 (H) 02/24/2019   The 10-year ASCVD risk score Mikey Bussing DC Jr., et al., 2013) is: 4.4%   Values used to calculate the score:     Age: 59  years     Sex: Female     Is Non-Hispanic African American: No     Diabetic: No     Tobacco smoker: No     Systolic Blood Pressure: 233 mmHg     Is BP treated: Yes     HDL Cholesterol: 34 mg/dL     Total Cholesterol: 158 mg/dL   4. BMI 27.0-27.9,adult Weight is down 7lbs from last visit- weighed at home 165  Wt Readings from Last 3 Encounters:  03/24/19 172 lb (78 kg)  02/20/19 170 lb (77.1 kg)  08/30/18 172 lb 3.2 oz (78.1 kg)   BMI Readings from Last 3 Encounters:  03/24/19 26.94 kg/m  02/20/19 26.63 kg/m  08/30/18 26.97 kg/m     5. Osteoporosis without current pathological fracture, unspecified osteoporosis type Reports no pain or weakness, does take calcium supplements.   6. Iron deficiency anemia, unspecified iron deficiency anemia type Has had no dizzy spells. Does take iron supplementation.  Lab Results  Component Value Date   HGB 14.4 02/24/2019     7. Gastroesophageal reflux disease without esophagitis Does not have complaints at this time.   8. External hemorrhoids Does not have any complaints at this time.     Outpatient Encounter Medications as of 01/19/2020  Medication Sig  . amoxicillin-clavulanate (AUGMENTIN) 875-125 MG tablet Take 1 tablet by mouth 2 (two) times daily.  . cholecalciferol (VITAMIN D) 1000 units tablet Take 2 tablets (2,000 Units total) by mouth daily.  . Cyanocobalamin (VITAMIN  B 12 PO) Take by mouth.  . diclofenac (VOLTAREN) 75 MG EC tablet Take 1 tablet (75 mg total) by mouth 2 (two) times daily. For muscle and  Joint pain  . furosemide (LASIX) 20 MG tablet Take 0.5-1 tablets (10-20 mg total) by mouth daily.   Facility-Administered Encounter Medications as of 01/19/2020  Medication  . betamethasone acetate-betamethasone sodium phosphate (CELESTONE) injection 6 mg    Past Surgical History:  Procedure Laterality Date  . INNER EAR SURGERY    . TUBAL LIGATION      Family History  Problem Relation Age of Onset  . Colon  cancer Father 33  . COPD Mother   . Heart disease Mother        MI  . Diabetes Mother   . Osteoporosis Sister   . Cancer Paternal Aunt        breast  . Breast cancer Maternal Grandmother   . Esophageal cancer Neg Hx   . Rectal cancer Neg Hx   . Stomach cancer Neg Hx     New complaints: No new complaints at this time.   Social history: Lives at home with her husband, works 7 days on and 3 off at work.   Controlled substance contract: n/a     Review of Systems  Constitutional: Negative.   HENT: Negative.   Eyes: Negative.   Respiratory: Negative.   Cardiovascular: Negative.   Gastrointestinal: Negative.   Genitourinary: Negative.   Musculoskeletal: Negative.   Skin: Negative.   Neurological: Negative.   Endo/Heme/Allergies: Negative.   Psychiatric/Behavioral: Negative.      Observations/Objective: Alert and oriented- answers all questions appropriately No distress    Assessment and Plan: Maria [name] in today with chief complaint of Medical Management of Chronic Issues   1. Essential hypertension Low sodium diet - CBC with Differential/Platelet; Future - CMP14+EGFR; Future  2. Celiac disease contnieu to watch diet  3. Hyperlipidemia with target LDL less than 100 Low fat diet - Lipid panel; Future  4. BMI 27.0-27.9,adult Discussed diet and exercise for person with BMI >25 Will recheck weight in 3-6 months  5. Osteoporosis without current pathological fracture, unspecified osteoporosis type Weight bearing exercises  6. Iron deficiency anemia, unspecified iron deficiency anemia type Patient will come in for repeat labs  7. Gastroesophageal reflux disease without esophagitis Avoid spicy foods Do not eat 2 hours prior to bedtime  8. External hemorrhoids  9. Peripheral edema Elevate legs  Continue maxide as rx     Follow Up Instructions: 6 months    I discussed the assessment and treatment plan with the patient. The patient was provided  an opportunity to ask questions and all were answered. The patient agreed with the plan and demonstrated an understanding of the instructions.   The patient was advised to call back or seek an in-person evaluation if the symptoms worsen or if the condition fails to improve as anticipated.  The above assessment and management plan was discussed with the patient. The patient verbalized understanding of and has agreed to the management plan. Patient is aware to call the clinic if symptoms persist or worsen. Patient is aware when to return to the clinic for a follow-up visit. Patient educated on when it is appropriate to go to the emergency department.   Time call ended:  10:55  I provided 15 minutes of non-face-to-face time during this encounter.    Tresa Garter, RN

## 2020-01-24 ENCOUNTER — Other Ambulatory Visit: Payer: BC Managed Care – PPO

## 2020-01-24 ENCOUNTER — Other Ambulatory Visit: Payer: Self-pay

## 2020-01-24 DIAGNOSIS — I10 Essential (primary) hypertension: Secondary | ICD-10-CM

## 2020-01-24 DIAGNOSIS — E785 Hyperlipidemia, unspecified: Secondary | ICD-10-CM | POA: Diagnosis not present

## 2020-01-24 LAB — CBC WITH DIFFERENTIAL/PLATELET
Basophils Absolute: 0.1 10*3/uL (ref 0.0–0.2)
Basos: 1 %
EOS (ABSOLUTE): 0.3 10*3/uL (ref 0.0–0.4)
Eos: 4 %
Hematocrit: 43.8 % (ref 34.0–46.6)
Hemoglobin: 14.9 g/dL (ref 11.1–15.9)
Immature Grans (Abs): 0 10*3/uL (ref 0.0–0.1)
Immature Granulocytes: 0 %
Lymphocytes Absolute: 2.4 10*3/uL (ref 0.7–3.1)
Lymphs: 29 %
MCH: 30.8 pg (ref 26.6–33.0)
MCHC: 34 g/dL (ref 31.5–35.7)
MCV: 91 fL (ref 79–97)
Monocytes Absolute: 0.9 10*3/uL (ref 0.1–0.9)
Monocytes: 11 %
Neutrophils Absolute: 4.7 10*3/uL (ref 1.4–7.0)
Neutrophils: 55 %
Platelets: 314 10*3/uL (ref 150–450)
RBC: 4.83 x10E6/uL (ref 3.77–5.28)
RDW: 12.5 % (ref 11.7–15.4)
WBC: 8.4 10*3/uL (ref 3.4–10.8)

## 2020-01-24 LAB — LIPID PANEL
Chol/HDL Ratio: 3.6 ratio (ref 0.0–4.4)
Cholesterol, Total: 174 mg/dL (ref 100–199)
HDL: 49 mg/dL (ref >39)
LDL Chol Calc (NIH): 107 mg/dL — ABNORMAL HIGH (ref 0–99)
Triglycerides: 101 mg/dL (ref 0–149)
VLDL Cholesterol Cal: 18 mg/dL (ref 5–40)

## 2020-01-24 LAB — CMP14+EGFR
ALT: 34 IU/L — ABNORMAL HIGH (ref 0–32)
AST: 29 IU/L (ref 0–40)
Albumin/Globulin Ratio: 1.4 (ref 1.2–2.2)
Albumin: 4.1 g/dL (ref 3.8–4.9)
Alkaline Phosphatase: 95 IU/L (ref 48–121)
BUN/Creatinine Ratio: 22 (ref 9–23)
BUN: 15 mg/dL (ref 6–24)
Bilirubin Total: 0.2 mg/dL (ref 0.0–1.2)
CO2: 24 mmol/L (ref 20–29)
Calcium: 8.9 mg/dL (ref 8.7–10.2)
Chloride: 101 mmol/L (ref 96–106)
Creatinine, Ser: 0.69 mg/dL (ref 0.57–1.00)
GFR calc Af Amer: 110 mL/min/{1.73_m2} (ref >59)
GFR calc non Af Amer: 96 mL/min/{1.73_m2} (ref >59)
Globulin, Total: 3 g/dL (ref 1.5–4.5)
Glucose: 108 mg/dL — ABNORMAL HIGH (ref 65–99)
Potassium: 4.2 mmol/L (ref 3.5–5.2)
Sodium: 140 mmol/L (ref 134–144)
Total Protein: 7.1 g/dL (ref 6.0–8.5)

## 2020-05-30 ENCOUNTER — Telehealth: Payer: Self-pay

## 2020-05-30 NOTE — Telephone Encounter (Signed)
  Prescription Request  05/30/2020  What is the name of the medication or equipment? Ativn/ pt said her blood pressure has been running high 180/80. Pt made an appt on 12/21 if she needs to be seen  Have you contacted your pharmacy to request a refill? (if applicable) na  Which pharmacy would you like this sent to? [facility]   Patient notified that their request is being sent to the clinical staff for review and that they should receive a response within 2 business days.

## 2020-05-30 NOTE — Telephone Encounter (Signed)
Spoke with patient, she has been having episodes of elevated blood pressure but she feels it may be coming from stress.  She reports she has a lot going on at work and this is when it seems to happen.  She has not noticed any episodes when she is at home.  She has an old prescription for Ativan and has been taking it when this happens.  She has an appointment scheduled for 06/04/20 with Shelah Lewandowsky.  I advised patient to monitor her blood pressure twice daily and report those numbers at her upcoming appointment.

## 2020-06-02 DIAGNOSIS — J029 Acute pharyngitis, unspecified: Secondary | ICD-10-CM | POA: Diagnosis not present

## 2020-06-02 DIAGNOSIS — J329 Chronic sinusitis, unspecified: Secondary | ICD-10-CM | POA: Diagnosis not present

## 2020-06-02 DIAGNOSIS — R059 Cough, unspecified: Secondary | ICD-10-CM | POA: Diagnosis not present

## 2020-06-04 ENCOUNTER — Encounter: Payer: Self-pay | Admitting: Nurse Practitioner

## 2020-06-04 ENCOUNTER — Ambulatory Visit (INDEPENDENT_AMBULATORY_CARE_PROVIDER_SITE_OTHER): Payer: BC Managed Care – PPO | Admitting: Nurse Practitioner

## 2020-06-04 DIAGNOSIS — I10 Essential (primary) hypertension: Secondary | ICD-10-CM | POA: Diagnosis not present

## 2020-06-04 DIAGNOSIS — F419 Anxiety disorder, unspecified: Secondary | ICD-10-CM

## 2020-06-04 MED ORDER — LORAZEPAM 0.5 MG PO TABS: 0.5000 mg | ORAL_TABLET | Freq: Two times a day (BID) | ORAL | 1 refills | Status: DC | PRN

## 2020-06-04 NOTE — Progress Notes (Signed)
Virtual Visit via telephone Note Due to COVID-19 pandemic this visit was conducted virtually. This visit type was conducted due to national recommendations for restrictions regarding the COVID-19 Pandemic (e.g. social distancing, sheltering in place) in an effort to limit this patient's exposure and mitigate transmission in our community. All issues noted in this document were discussed and addressed.  A physical exam was not performed with this format.  I connected with Maria [name] on 06/04/20 at 9:50 by telephone and verified that I am speaking with the correct person using two identifiers. Maria [name] is currently located at work and no one is currently with her during visit. The provider, Mary-Margaret Hassell Done, FNP is located in their office at time of visit.  I discussed the limitations, risks, security and privacy concerns of performing an evaluation and management service by telephone and the availability of in person appointments. I also discussed with the patient that there may be a patient responsible charge related to this service. The patient expressed understanding and agreed to proceed.   History and Present Illness:   Chief Complaint: Hypertension   HPI Patient calls in C/O blood pressure being up some. She says work has been really stressful lately. She has been checking blood pressure.  At home is usually below [id number] with diastolic in the 26'S. One day at work last week it was  180/96.  She is on maxide 75/50 1 daily. She has some ativan at home but she very rarely takes it.  GAD 7 : Generalized Anxiety Score 06/04/2020  Nervous, Anxious, on Edge 2  Control/stop worrying 2  Worry too much - different things 2  Trouble relaxing 0  Restless 0  Easily annoyed or irritable 0  Afraid - awful might happen 0  Total GAD 7 Score 6  Anxiety Difficulty Somewhat difficult      Review of Systems  Respiratory: Negative.   Cardiovascular: Negative.   Genitourinary:  Negative.   Musculoskeletal: Negative.   Neurological: Negative.   Psychiatric/Behavioral: Negative for depression. The patient is not nervous/anxious.   All other systems reviewed and are negative.    Observations/Objective: Alert and oriented- answers all questions appropriately No distress    Assessment and Plan: Quana Chamberlain in today with chief complaint of Hypertension   1. Anxiety Stress management Patient refuses celexa or lexapro- does not want to take anything daily Take ativan as needed- use sparingly - LORazepam (ATIVAN) 0.5 MG tablet; Take 1 tablet (0.5 mg total) by mouth 2 (two) times daily as needed for anxiety.  Dispense: 30 tablet; Refill: 1  2. Essential hypertension Keep diary of blood pressure    Follow Up Instructions: prn    I discussed the assessment and treatment plan with the patient. The patient was provided an opportunity to ask questions and all were answered. The patient agreed with the plan and demonstrated an understanding of the instructions.   The patient was advised to call back or seek an in-person evaluation if the symptoms worsen or if the condition fails to improve as anticipated.  The above assessment and management plan was discussed with the patient. The patient verbalized understanding of and has agreed to the management plan. Patient is aware to call the clinic if symptoms persist or worsen. Patient is aware when to return to the clinic for a follow-up visit. Patient educated on when it is appropriate to go to the emergency department.   Time call ended:  10:05  I provided 15 minutes of non-face-to-face time  during this encounter.    Mary-Margaret Hassell Done, FNP

## 2020-06-10 DIAGNOSIS — H43811 Vitreous degeneration, right eye: Secondary | ICD-10-CM | POA: Diagnosis not present

## 2020-06-10 DIAGNOSIS — H5211 Myopia, right eye: Secondary | ICD-10-CM | POA: Diagnosis not present

## 2020-06-10 DIAGNOSIS — H524 Presbyopia: Secondary | ICD-10-CM | POA: Diagnosis not present

## 2020-06-10 DIAGNOSIS — H40053 Ocular hypertension, bilateral: Secondary | ICD-10-CM | POA: Diagnosis not present

## 2020-06-10 DIAGNOSIS — H52203 Unspecified astigmatism, bilateral: Secondary | ICD-10-CM | POA: Diagnosis not present

## 2020-06-10 DIAGNOSIS — H16423 Pannus (corneal), bilateral: Secondary | ICD-10-CM | POA: Diagnosis not present

## 2020-06-10 DIAGNOSIS — D3132 Benign neoplasm of left choroid: Secondary | ICD-10-CM | POA: Diagnosis not present

## 2020-07-01 ENCOUNTER — Other Ambulatory Visit: Payer: Self-pay | Admitting: Nurse Practitioner

## 2020-07-08 DIAGNOSIS — H5213 Myopia, bilateral: Secondary | ICD-10-CM | POA: Diagnosis not present

## 2020-07-22 ENCOUNTER — Ambulatory Visit: Payer: Self-pay | Admitting: Nurse Practitioner

## 2020-08-20 ENCOUNTER — Ambulatory Visit: Payer: BC Managed Care – PPO | Admitting: Nurse Practitioner

## 2020-08-20 ENCOUNTER — Ambulatory Visit (INDEPENDENT_AMBULATORY_CARE_PROVIDER_SITE_OTHER): Payer: BC Managed Care – PPO

## 2020-08-20 ENCOUNTER — Other Ambulatory Visit: Payer: Self-pay

## 2020-08-20 ENCOUNTER — Encounter: Payer: Self-pay | Admitting: Nurse Practitioner

## 2020-08-20 VITALS — BP 102/63 | HR 71 | Temp 97.4°F | Resp 20 | Ht 67.0 in | Wt 172.0 lb

## 2020-08-20 DIAGNOSIS — Z13820 Encounter for screening for osteoporosis: Secondary | ICD-10-CM

## 2020-08-20 DIAGNOSIS — E039 Hypothyroidism, unspecified: Secondary | ICD-10-CM | POA: Diagnosis not present

## 2020-08-20 DIAGNOSIS — Z780 Asymptomatic menopausal state: Secondary | ICD-10-CM

## 2020-08-20 DIAGNOSIS — R609 Edema, unspecified: Secondary | ICD-10-CM

## 2020-08-20 DIAGNOSIS — Z1211 Encounter for screening for malignant neoplasm of colon: Secondary | ICD-10-CM

## 2020-08-20 DIAGNOSIS — Z1212 Encounter for screening for malignant neoplasm of rectum: Secondary | ICD-10-CM

## 2020-08-20 DIAGNOSIS — E8881 Metabolic syndrome: Secondary | ICD-10-CM | POA: Diagnosis not present

## 2020-08-20 MED ORDER — FUROSEMIDE 20 MG PO TABS: 20.0000 mg | ORAL_TABLET | Freq: Every day | ORAL | 3 refills | Status: DC

## 2020-08-20 MED ORDER — LEVOTHYROXINE SODIUM 50 MCG PO TABS: 50.0000 ug | ORAL_TABLET | Freq: Every day | ORAL | 3 refills | Status: DC

## 2020-08-20 NOTE — Patient Instructions (Signed)

## 2020-08-20 NOTE — Progress Notes (Signed)
   Subjective:    Patient ID: Maria [name], female    DOB: [birth year], 60 y.o.   MRN: [id number]   Chief Complaint: Joint Swelling (Ankles/)   HPI Patient come sin  Today c/o bil ankle swelling after she has worked all day. Usually resolves at night.  * she had labs done at work and her Harney came back as 5.9% and TSh was upo at 4.88. Review of Systems  Constitutional: Negative.   Respiratory: Negative for shortness of breath.   Cardiovascular: Positive for leg swelling. Negative for chest pain.  Neurological: Negative.  Negative for dizziness and headaches.  Psychiatric/Behavioral: Negative.        Objective:   Physical Exam Vitals and nursing note reviewed.  Constitutional:      Appearance: Normal appearance.  Cardiovascular:     Rate and Rhythm: Normal rate and regular rhythm.  Pulmonary:     Breath sounds: Normal breath sounds.  Skin:    General: Skin is warm.  Neurological:     General: No focal deficit present.     Mental Status: She is alert.  Psychiatric:        Mood and Affect: Mood normal.        Behavior: Behavior normal.    BP 102/63   Pulse 71   Temp (!) 97.4 F (36.3 C) (Temporal)   Resp 20   Ht 5' 7"  (1.702 m)   Wt 172 lb (78 kg)   SpO2 98%   BMI 26.94 kg/m        Assessment & Plan:  Maria [name] comes in today with chief complaint of Joint Swelling (Ankles/)   Diagnosis and orders addressed:  1. Peripheral edema Stop maxide Changed to lasix - furosemide (LASIX) 20 MG tablet; Take 1 tablet (20 mg total) by mouth daily.  Dispense: 30 tablet; Refill: 3  2. Acquired hypothyroidism repeat labs in 8 weeks - levothyroxine (SYNTHROID) 50 MCG tablet; Take 1 tablet (50 mcg total) by mouth daily.  Dispense: 90 tablet; Refill: 3  3. Metabolic syndrome Watch carbs  In diet   Labs pending Health Maintenance reviewed Diet and exercise encouraged  Follow up plan: 8 weeks   Mary-Margaret Hassell Done, FNP

## 2020-08-23 DIAGNOSIS — M85852 Other specified disorders of bone density and structure, left thigh: Secondary | ICD-10-CM | POA: Diagnosis not present

## 2020-08-23 DIAGNOSIS — Z780 Asymptomatic menopausal state: Secondary | ICD-10-CM | POA: Diagnosis not present

## 2020-09-02 ENCOUNTER — Telehealth: Payer: Self-pay

## 2020-09-02 NOTE — Telephone Encounter (Signed)
Please review and advise.

## 2020-09-02 NOTE — Telephone Encounter (Signed)
She wearing compression socks? She will have to speak to GI about colonoscopy.

## 2020-09-06 ENCOUNTER — Telehealth: Payer: Self-pay

## 2020-09-06 NOTE — Telephone Encounter (Signed)
Called patient to f/u in regards to DEXA results as she has not viewed in [place]

## 2020-10-01 ENCOUNTER — Other Ambulatory Visit: Payer: Self-pay | Admitting: Nurse Practitioner

## 2020-10-01 DIAGNOSIS — Z1231 Encounter for screening mammogram for malignant neoplasm of breast: Secondary | ICD-10-CM

## 2020-10-03 ENCOUNTER — Encounter: Payer: Self-pay | Admitting: Nurse Practitioner

## 2020-10-03 ENCOUNTER — Ambulatory Visit: Payer: BC Managed Care – PPO | Admitting: Nurse Practitioner

## 2020-10-03 ENCOUNTER — Other Ambulatory Visit: Payer: Self-pay

## 2020-10-03 VITALS — BP 109/64 | HR 77 | Temp 97.9°F | Resp 20 | Ht 67.0 in | Wt 174.0 lb

## 2020-10-03 DIAGNOSIS — J301 Allergic rhinitis due to pollen: Secondary | ICD-10-CM | POA: Diagnosis not present

## 2020-10-03 DIAGNOSIS — R5383 Other fatigue: Secondary | ICD-10-CM | POA: Diagnosis not present

## 2020-10-03 DIAGNOSIS — K900 Celiac disease: Secondary | ICD-10-CM | POA: Diagnosis not present

## 2020-10-03 MED ORDER — CETIRIZINE HCL 10 MG PO TABS: 10.0000 mg | ORAL_TABLET | Freq: Every day | ORAL | 1 refills | Status: DC

## 2020-10-03 MED ORDER — FLUTICASONE PROPIONATE 50 MCG/ACT NA SUSP: 2.0000 | Freq: Every day | NASAL | 1 refills | Status: DC

## 2020-10-03 NOTE — Patient Instructions (Signed)

## 2020-10-03 NOTE — Progress Notes (Signed)
   Subjective:    Patient ID: Maria [name], female    DOB: 09-02-1960, 60 y.o.   MRN: [id number]   Chief Complaint: Fatigue and Allergic Rhinitis    HPI Patient comes in today with 2 complaints: - Fatigue- she says she has just been so tired. She has been having to take naps which is unusual for her. TSH was low when labs were checked at work and we started her on levothyroxine 18mg daily. - Allergic rhinitis- her allergies have been really bad. She use to be on zyrtec and would like a prescription.    Review of Systems  Constitutional: Negative for diaphoresis.  Eyes: Negative for pain.  Respiratory: Negative for shortness of breath.   Cardiovascular: Negative for chest pain, palpitations and leg swelling.  Gastrointestinal: Negative for abdominal pain.  Endocrine: Negative for polydipsia.  Skin: Negative for rash.  Neurological: Negative for dizziness, weakness and headaches.  Hematological: Does not bruise/bleed easily.  All other systems reviewed and are negative.      Objective:   Physical Exam Vitals and nursing note reviewed.  Constitutional:      Appearance: Normal appearance.  Cardiovascular:     Rate and Rhythm: Normal rate and regular rhythm.     Heart sounds: Normal heart sounds.  Pulmonary:     Effort: Pulmonary effort is normal.     Breath sounds: Normal breath sounds.  Skin:    General: Skin is warm.  Neurological:     General: No focal deficit present.     Mental Status: She is alert and oriented to person, place, and time.  Psychiatric:        Mood and Affect: Mood normal.        Behavior: Behavior normal.     BP 109/64   Pulse 77   Temp 97.9 F (36.6 C) (Temporal)   Resp 20   Ht 5' 7"  (1.702 m)   Wt 174 lb (78.9 kg)   SpO2 94%   BMI 27.25 kg/m       Assessment & Plan:  SBona Hubbardin today with chief complaint of Fatigue and Allergic Rhinitis  (Discuss bone density results/)   1. Fatigue, unspecified type Labs pending - Thyroid  Panel With TSH - Vitamin B12  2. Seasonal allergic rhinitis due to pollen Wear mask when outside or doing yard work. - cetirizine (ZYRTEC) 10 MG tablet; Take 1 tablet (10 mg total) by mouth daily.  Dispense: 90 tablet; Refill: 1 - fluticasone (FLONASE) 50 MCG/ACT nasal spray; Place 2 sprays into both nostrils daily.  Dispense: 48 g; Refill: 1    The above assessment and management plan was discussed with the patient. The patient verbalized understanding of and has agreed to the management plan. Patient is aware to call the clinic if symptoms persist or worsen. Patient is aware when to return to the clinic for a follow-up visit. Patient educated on when it is appropriate to go to the emergency department.   Mary-Margaret MHassell Done FNP

## 2020-10-04 LAB — VITAMIN B12: Vitamin B-12: >2000 pg/mL — ABNORMAL HIGH (ref 232–1245)

## 2020-10-04 LAB — THYROID PANEL WITH TSH
Free Thyroxine Index: 1.9 (ref 1.2–4.9)
T3 Uptake Ratio: 24 % (ref 24–39)
T4, Total: 7.9 ug/dL (ref 4.5–12.0)
TSH: 1.77 u[IU]/mL (ref 0.450–4.500)

## 2020-10-07 MED ORDER — LEVOTHYROXINE SODIUM 75 MCG PO TABS: 75.0000 ug | ORAL_TABLET | Freq: Every day | ORAL | 3 refills | Status: DC

## 2020-10-07 NOTE — Addendum Note (Signed)
Addended by: Chevis Pretty on: 10/07/2020 01:18 PM   Modules accepted: Orders

## 2020-10-17 ENCOUNTER — Other Ambulatory Visit: Payer: Self-pay | Admitting: Nurse Practitioner

## 2020-10-17 MED ORDER — TRIAMTERENE-HCTZ 37.5-25 MG PO TABS: 1.0000 | ORAL_TABLET | Freq: Every day | ORAL | 1 refills | Status: DC

## 2020-10-25 ENCOUNTER — Ambulatory Visit: Payer: Self-pay | Admitting: Nurse Practitioner

## 2020-10-31 ENCOUNTER — Other Ambulatory Visit: Payer: Self-pay

## 2020-10-31 ENCOUNTER — Ambulatory Visit: Payer: BC Managed Care – PPO | Admitting: Family Medicine

## 2020-10-31 ENCOUNTER — Encounter: Payer: Self-pay | Admitting: Family Medicine

## 2020-10-31 VITALS — BP 111/64 | HR 65 | Temp 96.8°F | Ht 67.0 in | Wt 172.0 lb

## 2020-10-31 DIAGNOSIS — G5603 Carpal tunnel syndrome, bilateral upper limbs: Secondary | ICD-10-CM

## 2020-10-31 MED ORDER — PREDNISONE 10 MG (21) PO TBPK: ORAL_TABLET | ORAL | 0 refills | Status: DC

## 2020-10-31 NOTE — Progress Notes (Signed)
Assessment & Plan:  1. Bilateral carpal tunnel syndrome Education provided on carpal tunnel. Encouraged use of wrist splints at night. Discussed if this is not effective we can refer to ortho.  - predniSONE (STERAPRED UNI-PAK 21 TAB) 10 MG (21) TBPK tablet; As directed x 6 days  Dispense: 21 tablet; Refill: 0   Follow up plan: Return if symptoms worsen or fail to improve.  Hendricks Limes, MSN, APRN, FNP-C [facility]  Subjective:   Patient ID: Maria [name], female    DOB: 05-04-1961, 60 y.o.   MRN: [id number]  HPI: Maria [name] is a 60 y.o. female presenting on 10/31/2020 for Numbness (Patient states that she has been having bilateral numbness in hands for a few months )  Patient reports bilateral hand numbness (right worse than left) that has been going on for a few months. It is always present in the mornings when she wakes up. Normal thyroid labs 4 weeks ago.    ROS: Negative unless specifically indicated above in HPI.   Relevant past medical history reviewed and updated as indicated.   Allergies and medications reviewed and updated.   Current Outpatient Medications:  .  cetirizine (ZYRTEC) 10 MG tablet, Take 1 tablet (10 mg total) by mouth daily., Disp: 90 tablet, Rfl: 1 .  cholecalciferol (VITAMIN D) 1000 units tablet, Take 2 tablets (2,000 Units total) by mouth daily., Disp: , Rfl:  .  Cyanocobalamin (VITAMIN B 12 PO), Take by mouth., Disp: , Rfl:  .  diclofenac (VOLTAREN) 75 MG EC tablet, Take 1 tablet (75 mg total) by mouth 2 (two) times daily. For muscle and  Joint pain (Patient taking differently: Take 75 mg by mouth 2 (two) times daily. For muscle and  Joint pain), Disp: 60 tablet, Rfl: 2 .  fluticasone (FLONASE) 50 MCG/ACT nasal spray, Place 2 sprays into both nostrils daily., Disp: 48 g, Rfl: 1 .  levothyroxine (SYNTHROID) 75 MCG tablet, Take 1 tablet (75 mcg total) by mouth daily., Disp: 90 tablet, Rfl: 3 .  LORazepam (ATIVAN) 0.5 MG tablet, Take 1  tablet (0.5 mg total) by mouth 2 (two) times daily as needed for anxiety., Disp: 30 tablet, Rfl: 1 .  triamterene-hydrochlorothiazide (MAXZIDE-25) 37.5-25 MG tablet, Take 1 tablet by mouth daily., Disp: 90 tablet, Rfl: 1  Current Facility-Administered Medications:  .  betamethasone acetate-betamethasone sodium phosphate (CELESTONE) injection 6 mg, 6 mg, Intramuscular, Once, Claretta Fraise, MD  Allergies  Allergen Reactions  . Claritin [Loratadine] Nausea And Vomiting    Objective:   BP 111/64   Pulse 65   Temp (!) 96.8 F (36 C) (Temporal)   Ht 5' 7"  (1.702 m)   Wt 172 lb (78 kg)   SpO2 96%   BMI 26.94 kg/m    Physical Exam Vitals reviewed.  Constitutional:      General: She is not in acute distress.    Appearance: Normal appearance. She is not ill-appearing, toxic-appearing or diaphoretic.  HENT:     Head: Normocephalic and atraumatic.  Eyes:     General: No scleral icterus.       Right eye: No discharge.        Left eye: No discharge.     Conjunctiva/sclera: Conjunctivae normal.  Cardiovascular:     Rate and Rhythm: Normal rate.  Pulmonary:     Effort: Pulmonary effort is normal. No respiratory distress.  Musculoskeletal:        General: Normal range of motion.     Cervical back: Normal  range of motion.     Comments: Positive phalen's test.  Skin:    General: Skin is warm and dry.     Capillary Refill: Capillary refill takes less than 2 seconds.  Neurological:     General: No focal deficit present.     Mental Status: She is alert and oriented to person, place, and time. Mental status is at baseline.  Psychiatric:        Mood and Affect: Mood normal.        Behavior: Behavior normal.        Thought Content: Thought content normal.        Judgment: Judgment normal.

## 2020-10-31 NOTE — Patient Instructions (Signed)
Carpal Tunnel Syndrome  Carpal tunnel syndrome is a condition that causes pain, weakness, and numbness in your hand and arm. Numbness is when you cannot feel an area in your body. The carpal tunnel is a narrow area that is on the palm side of your wrist. Repeated wrist motion or certain diseases may cause swelling in the tunnel. This swelling can pinch the main nerve in the wrist. This nerve is called the median nerve. What are the causes? This condition may be caused by:  Moving your hand and wrist over and over again while doing a task.  Injury to the wrist.  Arthritis.  A sac of fluid (cyst) or abnormal growth (tumor) in the carpal tunnel.  Fluid buildup during pregnancy.  Use of tools that vibrate. Sometimes the cause is not known. What increases the risk? The following factors may make you more likely to have this condition:  Having a job that makes you do these things: ? Move your hand over and over again. ? Work with tools that vibrate, such as drills or sanders.  Being a woman.  Having diabetes, obesity, thyroid problems, or kidney failure. What are the signs or symptoms? Symptoms of this condition include:  A tingling feeling in your fingers.  Tingling or loss of feeling in your hand.  Pain in your entire arm. This pain may get worse when you bend your wrist and elbow for a long time.  Pain in your wrist that goes up your arm to your shoulder.  Pain that goes down into your palm or fingers.  Weakness in your hands. You may find it hard to grab and hold items. You may feel worse at night. How is this treated? This condition may be treated with:  Lifestyle changes. You will be asked to stop or change the activity that caused your problem.  Doing exercises and activities that make bones, muscles, and tendons stronger (physical therapy).  Learning how to use your hand again (occupational therapy).  Medicines for pain and swelling. You may have injections in  your wrist.  A wrist splint or brace.  Surgery. Follow these instructions at home: If you have a splint or brace:  Wear the splint or brace as told by your doctor. Take it off only as told by your doctor.  Loosen the splint if your fingers: ? Tingle. ? Become numb. ? Turn cold and blue.  Keep the splint or brace clean.  If the splint or brace is not waterproof: ? Do not let it get wet. ? Cover it with a watertight covering when you take a bath or a shower. Managing pain, stiffness, and swelling If told, put ice on the painful area:  If you have a removable splint or brace, remove it as told by your doctor.  Put ice in a plastic bag.  Place a towel between your skin and the bag.  Leave the ice on for 20 minutes, 2-3 times per day. Do not fall asleep with the cold pack on your skin.  Take off the ice if your skin turns bright red. This is very important. If you cannot feel pain, heat, or cold, you have a greater risk of damage to the area. Move your fingers often to reduce stiffness and swelling.   General instructions  Take over-the-counter and prescription medicines only as told by your doctor.  Rest your wrist from any activity that may cause pain. If needed, talk with your boss at work about changes that can   help your wrist heal.  Do exercises as told by your doctor, physical therapist, or occupational therapist.  Keep all follow-up visits. Contact a doctor if:  You have new symptoms.  Medicine does not help your pain.  Your symptoms get worse. Get help right away if:  You have very bad numbness or tingling in your wrist or hand. Summary  Carpal tunnel syndrome is a condition that causes pain in your hand and arm.  It is often caused by repeated wrist motions.  Lifestyle changes and medicines are used to treat this problem. Surgery may help in very bad cases.  Follow your doctor's instructions about wearing a splint, resting your wrist, keeping follow-up  visits, and calling for help. This information is not intended to replace advice given to you by your health care provider. Make sure you discuss any questions you have with your health care provider. Document Revised: 10/12/2019 Document Reviewed: 10/12/2019 Elsevier Patient Education  2021 [employer]  

## 2020-11-12 DIAGNOSIS — Z20822 Contact with and (suspected) exposure to covid-19: Secondary | ICD-10-CM | POA: Diagnosis not present

## 2020-11-13 ENCOUNTER — Ambulatory Visit: Payer: BC Managed Care – PPO | Admitting: Nurse Practitioner

## 2020-11-13 ENCOUNTER — Encounter: Payer: Self-pay | Admitting: Nurse Practitioner

## 2020-11-13 DIAGNOSIS — J069 Acute upper respiratory infection, unspecified: Secondary | ICD-10-CM | POA: Diagnosis not present

## 2020-11-13 DIAGNOSIS — U071 COVID-19: Secondary | ICD-10-CM

## 2020-11-13 MED ORDER — MOMETASONE FUROATE 50 MCG/ACT NA SUSP: 2.0000 | Freq: Every day | NASAL | 12 refills | Status: DC

## 2020-11-13 MED ORDER — DM-GUAIFENESIN ER 30-600 MG PO TB12
1.0000 | ORAL_TABLET | Freq: Two times a day (BID) | ORAL | 0 refills | Status: DC
Start: 2020-11-13 — End: 2021-02-04

## 2020-11-13 MED ORDER — AMOXICILLIN-POT CLAVULANATE 875-125 MG PO TABS: 1.0000 | ORAL_TABLET | Freq: Two times a day (BID) | ORAL | 0 refills | Status: DC

## 2020-11-13 NOTE — Assessment & Plan Note (Signed)
Educated patient on the importance of completing COVID-19 vaccine. Symptom management ongoing for at home COVID positive test.

## 2020-11-13 NOTE — Progress Notes (Signed)
   Virtual Visit  Note Due to COVID-19 pandemic this visit was conducted virtually. This visit type was conducted due to national recommendations for restrictions regarding the COVID-19 Pandemic (e.g. social distancing, sheltering in place) in an effort to limit this patient's exposure and mitigate transmission in our community. All issues noted in this document were discussed and addressed.  A physical exam was not performed with this format.  I connected with Maria [name] on 11/13/20 at  20: Okay 14 a.m. Marland Kitchen  60-year-old otherwise not related that when it is not by telephone and verified that I am speaking with the correct person using two identifiers. Maria [name] is currently located at home during visit. The provider, Ivy Lynn, NP is located in their office at time of visit.  I discussed the limitations, risks, security and privacy concerns of performing an evaluation and management service by telephone and the availability of in person appointments. I also discussed with the patient that there may be a patient responsible charge related to this service. The patient expressed understanding and agreed to proceed.   History and Present Illness:  URI  This is a new problem. Episode onset: In the past 4 to 5 days. The problem has been gradually worsening. The maximum temperature recorded prior to her arrival was 100.4 - 100.9 F. The fever has been present for less than 1 day. Associated symptoms include congestion, coughing and headaches. Pertinent negatives include no rash. She has tried nothing for the symptoms. The treatment provided no relief.      Review of Systems  Constitutional: Positive for chills and fever.  HENT: Positive for congestion.   Respiratory: Positive for cough.   Skin: Negative for rash.  Neurological: Positive for headaches.  All other systems reviewed and are negative.    Observations/Objective: Televisit patient did not sound to be in distress.  Assessment  and Plan: Worsening upper respiratory infection with dry cough, runny nose, body aches, chills, temperature 100.5 for 24 hours, dull headache.  Patient completed at home COVID test was positive.  Patient has not taken COVID-19 vaccine. Education provided to patient on the importance of completing COVID-19 vaccine, Take medication as prescribed Increase hydration Tylenol/ibuprofen for headache and fever Augmentin 875-125 mg tablet by mouth. Guaifenesin for cough and congestion Nasonex nasal spray.    Follow Up Instructions: Follow-up with worsening unresolved symptoms    I discussed the assessment and treatment plan with the patient. The patient was provided an opportunity to ask questions and all were answered. The patient agreed with the plan and demonstrated an understanding of the instructions.   The patient was advised to call back or seek an in-person evaluation if the symptoms worsen or if the condition fails to improve as anticipated.  The above assessment and management plan was discussed with the patient. The patient verbalized understanding of and has agreed to the management plan. Patient is aware to call the clinic if symptoms persist or worsen. Patient is aware when to return to the clinic for a follow-up visit. Patient educated on when it is appropriate to go to the emergency department.   Time call ended: 10:26 AM I provided 11 minutes of  non face-to-face time during this encounter.    Ivy Lynn, NP

## 2020-11-13 NOTE — Assessment & Plan Note (Signed)
Worsening upper respiratory infection with dry cough, runny nose, body aches, chills, temperature 100.5 for 24 hours, dull headache.  Patient completed at home COVID test was positive.  Patient has not taken COVID-19 vaccine. Education provided to patient on the importance of completing COVID-19 vaccine, Take medication as prescribed Increase hydration Tylenol/ibuprofen for headache and fever Augmentin 875-125 mg tablet by mouth. Guaifenesin for cough and congestion Nasonex nasal spray.

## 2020-11-14 ENCOUNTER — Ambulatory Visit: Payer: Self-pay | Admitting: Nurse Practitioner

## 2020-12-03 ENCOUNTER — Other Ambulatory Visit: Payer: Self-pay

## 2020-12-03 ENCOUNTER — Ambulatory Visit
Admission: RE | Admit: 2020-12-03 | Discharge: 2020-12-03 | Disposition: A | Discharge status: Home / Self Care | Payer: BC Managed Care – PPO | Source: Ambulatory Visit | Attending: Nurse Practitioner | Admitting: Nurse Practitioner

## 2020-12-03 DIAGNOSIS — Z1231 Encounter for screening mammogram for malignant neoplasm of breast: Secondary | ICD-10-CM | POA: Diagnosis not present

## 2021-01-07 ENCOUNTER — Other Ambulatory Visit: Payer: Self-pay | Admitting: Nurse Practitioner

## 2021-01-09 ENCOUNTER — Other Ambulatory Visit: Payer: Self-pay | Admitting: Nurse Practitioner

## 2021-01-09 DIAGNOSIS — F419 Anxiety disorder, unspecified: Secondary | ICD-10-CM

## 2021-02-04 ENCOUNTER — Other Ambulatory Visit: Payer: Self-pay

## 2021-02-04 ENCOUNTER — Ambulatory Visit (INDEPENDENT_AMBULATORY_CARE_PROVIDER_SITE_OTHER): Payer: BC Managed Care – PPO | Admitting: Nurse Practitioner

## 2021-02-04 ENCOUNTER — Encounter: Payer: Self-pay | Admitting: Nurse Practitioner

## 2021-02-04 VITALS — BP 103/69 | HR 73 | Temp 97.4°F | Resp 20 | Ht 67.0 in | Wt 173.0 lb

## 2021-02-04 DIAGNOSIS — K219 Gastro-esophageal reflux disease without esophagitis: Secondary | ICD-10-CM | POA: Diagnosis not present

## 2021-02-04 DIAGNOSIS — F419 Anxiety disorder, unspecified: Secondary | ICD-10-CM

## 2021-02-04 DIAGNOSIS — Z0001 Encounter for general adult medical examination with abnormal findings: Secondary | ICD-10-CM | POA: Diagnosis not present

## 2021-02-04 DIAGNOSIS — E785 Hyperlipidemia, unspecified: Secondary | ICD-10-CM

## 2021-02-04 DIAGNOSIS — Z23 Encounter for immunization: Secondary | ICD-10-CM

## 2021-02-04 DIAGNOSIS — Z0000 Encounter for general adult medical examination without abnormal findings: Secondary | ICD-10-CM

## 2021-02-04 DIAGNOSIS — K900 Celiac disease: Secondary | ICD-10-CM

## 2021-02-04 DIAGNOSIS — M810 Age-related osteoporosis without current pathological fracture: Secondary | ICD-10-CM | POA: Diagnosis not present

## 2021-02-04 DIAGNOSIS — E039 Hypothyroidism, unspecified: Secondary | ICD-10-CM

## 2021-02-04 DIAGNOSIS — I10 Essential (primary) hypertension: Secondary | ICD-10-CM | POA: Diagnosis not present

## 2021-02-04 DIAGNOSIS — Z6827 Body mass index (BMI) 27.0-27.9, adult: Secondary | ICD-10-CM

## 2021-02-04 DIAGNOSIS — D509 Iron deficiency anemia, unspecified: Secondary | ICD-10-CM

## 2021-02-04 DIAGNOSIS — R6889 Other general symptoms and signs: Secondary | ICD-10-CM | POA: Diagnosis not present

## 2021-02-04 MED ORDER — TRIAMTERENE-HCTZ 37.5-25 MG PO TABS: 1.0000 | ORAL_TABLET | Freq: Every day | ORAL | 1 refills | Status: DC

## 2021-02-04 MED ORDER — LORAZEPAM 0.5 MG PO TABS: 0.5000 mg | ORAL_TABLET | Freq: Two times a day (BID) | ORAL | 1 refills | Status: DC | PRN

## 2021-02-04 NOTE — Addendum Note (Signed)
Addended by: Rolena Infante on: 02/04/2021 12:57 PM   Modules accepted: Orders

## 2021-02-04 NOTE — Patient Instructions (Signed)
Exercising to Stay Healthy To become healthy and stay healthy, it is recommended that you do moderate-intensity and vigorous-intensity exercise. You can tell that you are exercising at a moderate intensity if your heart starts beating faster and you start breathing faster but can still hold a conversation. You can tell that you are exercising at a vigorous intensity if you are breathing much harder andfaster and cannot hold a conversation while exercising. Exercising regularly is important. It has many health benefits, such as: Improving overall fitness, flexibility, and endurance. Increasing bone density. Helping with weight control. Decreasing body fat. Increasing muscle strength. Reducing stress and tension. Improving overall health. How often should I exercise? Choose an activity that you enjoy, and set realistic goals. Your health careprovider can help you make an activity plan that works for you. Exercise regularly as told by your health care provider. This may include: Doing strength training two times a week, such as: Lifting weights. Using resistance bands. Push-ups. Sit-ups. Yoga. Doing a certain intensity of exercise for a given amount of time. Choose from these options: A total of 150 minutes of moderate-intensity exercise every week. A total of 75 minutes of vigorous-intensity exercise every week. A mix of moderate-intensity and vigorous-intensity exercise every week. Children, pregnant women, people who have not exercised regularly, people who are overweight, and older adults may need to talk with a health care provider about what activities are safe to do. If you have a medical condition, be sureto talk with your health care provider before you start a new exercise program. What are some exercise ideas? Moderate-intensity exercise ideas include: Walking 1 mile (1.6 km) in about 15 minutes. Biking. Hiking. Golfing. Dancing. Water aerobics. Vigorous-intensity exercise  ideas include: Walking 4.5 miles (7.2 km) or more in about 1 hour. Jogging or running 5 miles (8 km) in about 1 hour. Biking 10 miles (16.1 km) or more in about 1 hour. Lap swimming. Roller-skating or in-line skating. Cross-country skiing. Vigorous competitive sports, such as football, basketball, and soccer. Jumping rope. Aerobic dancing. What are some everyday activities that can help me to get exercise? Yard work, such as: Psychologist, educational. Raking and bagging leaves. Washing your car. Pushing a stroller. Shoveling snow. Gardening. Washing windows or floors. How can I be more active in my day-to-day activities? Use stairs instead of an elevator. Take a walk during your lunch break. If you drive, park your car farther away from your work or school. If you take public transportation, get off one stop early and walk the rest of the way. Stand up or walk around during all of your indoor phone calls. Get up, stretch, and walk around every 30 minutes throughout the day. Enjoy exercise with a friend. Support to continue exercising will help you keep a regular routine of activity. What guidelines can I follow while exercising? Before you start a new exercise program, talk with your health care provider. Do not exercise so much that you hurt yourself, feel dizzy, or get very short of breath. Wear comfortable clothes and wear shoes with good support. Drink plenty of water while you exercise to prevent dehydration or heat stroke. Work out until your breathing and your heartbeat get faster. Where to find more information [place] Department of Health and Human Services: [employer] Centers for Disease Control and Prevention (CDC): http://www.wolf.info/ Summary Exercising regularly is important. It will improve your overall fitness, flexibility, and endurance. Regular exercise also will improve your overall health. It can help you control your weight,  reduce stress, and improve your bone  density. Do not exercise so much that you hurt yourself, feel dizzy, or get very short of breath. Before you start a new exercise program, talk with your health care provider. This information is not intended to replace advice given to you by your health care provider. Make sure you discuss any questions you have with your healthcare provider. Document Revised: 05/17/2020 Document Reviewed: 05/17/2020 Elsevier Patient Education  2022 [employer].

## 2021-02-04 NOTE — Progress Notes (Signed)
Subjective:    Patient ID: Maria [name], female    DOB: [birth year], 60 y.o.   MRN: [id number]   Chief Complaint: Annual Exam (Bilateral middle finger pain/)    HPI:  1. Annual physical exam No pap today  2. Essential hypertension No c/o chest pain, sob or headache. Does not check blood pressure at h ome. BP Readings from Last 3 Encounters:  02/04/21 103/69  10/31/20 111/64  10/03/20 109/64     3. Gastroesophageal reflux disease without esophagitis Has not been having any recent symptoms.  4. Celiac disease She watches her diet closely to prevent flare ups  5. Hyperlipidemia with target LDL less than 100 Does watch diet and stays very active. Lab Results  Component Value Date   CHOL 174 01/24/2020   HDL 49 01/24/2020   LDLCALC 107 (H) 01/24/2020   TRIG 101 01/24/2020   CHOLHDL 3.6 01/24/2020     6. Iron deficiency anemia, unspecified iron deficiency anemia type No c/o fatigue. Lab Results  Component Value Date   HGB 14.9 01/24/2020     7. Osteoporosis without current pathological fracture, unspecified osteoporosis type Last dexascan was done 08/20/20. Her t score was -2.4. She is on calcium tablets.  8. Acquired hypothyroidism Patient was on levothyroxine but did not know that she needed to stay on it . She has not taken meds within the last month.  9. BMI 27.0-27.9,adult Wt Readings from Last 3 Encounters:  02/04/21 173 lb (78.5 kg)  10/31/20 172 lb (78 kg)  10/03/20 174 lb (78.9 kg)   BMI Readings from Last 3 Encounters:  02/04/21 27.10 kg/m  10/31/20 26.94 kg/m  10/03/20 27.25 kg/m       Outpatient Encounter Medications as of 02/04/2021  Medication Sig   Cyanocobalamin (VITAMIN B 12 PO) Take by mouth.   LORazepam (ATIVAN) 0.5 MG tablet Take 1 tablet (0.5 mg total) by mouth 2 (two) times daily as needed for anxiety.   triamterene-hydrochlorothiazide (MAXZIDE-25) 37.5-25 MG tablet Take 1 tablet by mouth daily.   [DISCONTINUED]  amoxicillin-clavulanate (AUGMENTIN) 875-125 MG tablet Take 1 tablet by mouth 2 (two) times daily.   [DISCONTINUED] cetirizine (ZYRTEC) 10 MG tablet Take 1 tablet (10 mg total) by mouth daily.   [DISCONTINUED] cholecalciferol (VITAMIN D) 1000 units tablet Take 2 tablets (2,000 Units total) by mouth daily.   [DISCONTINUED] dextromethorphan-guaiFENesin (MUCINEX DM) 30-600 MG 12hr tablet Take 1 tablet by mouth 2 (two) times daily.   [DISCONTINUED] diclofenac (VOLTAREN) 75 MG EC tablet Take 1 tablet (75 mg total) by mouth 2 (two) times daily. For muscle and  Joint pain (Patient taking differently: Take 75 mg by mouth 2 (two) times daily. For muscle and  Joint pain)   [DISCONTINUED] levothyroxine (SYNTHROID) 75 MCG tablet Take 1 tablet (75 mcg total) by mouth daily.   [DISCONTINUED] mometasone (NASONEX) 50 MCG/ACT nasal spray Place 2 sprays into the nose daily.   [DISCONTINUED] predniSONE (STERAPRED UNI-PAK 21 TAB) 10 MG (21) TBPK tablet As directed x 6 days   Facility-Administered Encounter Medications as of 02/04/2021  Medication   betamethasone acetate-betamethasone sodium phosphate (CELESTONE) injection 6 mg    Past Surgical History:  Procedure Laterality Date   INNER EAR SURGERY     TUBAL LIGATION      Family History  Problem Relation Age of Onset   Colon cancer Father 49   COPD Mother    Heart disease Mother        MI   Diabetes Mother  Osteoporosis Sister    Cancer Paternal Aunt        breast   Breast cancer Maternal Grandmother    Esophageal cancer Neg Hx    Rectal cancer Neg Hx    Stomach cancer Neg Hx     New complaints: Has occasional anxiety and takes ativan as needed.  Social history: Lives with her husband  Controlled substance contract: n/a     Review of Systems  Constitutional:  Negative for diaphoresis.  Eyes:  Negative for pain.  Respiratory:  Negative for shortness of breath.   Cardiovascular:  Negative for chest pain, palpitations and leg swelling.   Gastrointestinal:  Negative for abdominal pain.  Endocrine: Negative for polydipsia.  Skin:  Negative for rash.  Neurological:  Negative for dizziness, weakness and headaches.  Hematological:  Does not bruise/bleed easily.  All other systems reviewed and are negative.     Objective:   Physical Exam Vitals and nursing note reviewed.  Constitutional:      General: She is not in acute distress.    Appearance: Normal appearance. She is well-developed.  HENT:     Head: Normocephalic.     Right Ear: Tympanic membrane normal.     Left Ear: Tympanic membrane normal.     Nose: Nose normal.     Mouth/Throat:     Mouth: Mucous membranes are moist.  Eyes:     Pupils: Pupils are equal, round, and reactive to light.  Neck:     Vascular: No carotid bruit or JVD.  Cardiovascular:     Rate and Rhythm: Normal rate and regular rhythm.     Heart sounds: Normal heart sounds.  Pulmonary:     Effort: Pulmonary effort is normal. No respiratory distress.     Breath sounds: Normal breath sounds. No wheezing or rales.  Chest:     Chest wall: No tenderness.  Abdominal:     General: Bowel sounds are normal. There is no distension or abdominal bruit.     Palpations: Abdomen is soft. There is no hepatomegaly, splenomegaly, mass or pulsatile mass.     Tenderness: There is no abdominal tenderness.  Musculoskeletal:        General: Normal range of motion.     Cervical back: Normal range of motion and neck supple.  Lymphadenopathy:     Cervical: No cervical adenopathy.  Skin:    General: Skin is warm and dry.  Neurological:     Mental Status: She is alert and oriented to person, place, and time.     Deep Tendon Reflexes: Reflexes are normal and symmetric.  Psychiatric:        Behavior: Behavior normal.        Thought Content: Thought content normal.        Judgment: Judgment normal.   BP 103/69   Pulse 73   Temp (!) 97.4 F (36.3 C) (Temporal)   Resp 20   Ht 5' 7"  (1.702 m)   Wt 173 lb (78.5  kg)   SpO2 96%   BMI 27.10 kg/m         Assessment & Plan:  Maria [name] comes in today with chief complaint of Annual Exam (Bilateral middle finger pain/)   Diagnosis and orders addressed:  1. Annual physical exam - VITAMIN D 25 Hydroxy (Vit-D Deficiency, Fractures)  2. Essential hypertension Low sodium diet - triamterene-hydrochlorothiazide (MAXZIDE-25) 37.5-25 MG tablet; Take 1 tablet by mouth daily.  Dispense: 90 tablet; Refill: 1 - CBC with Differential/Platelet -  CMP14+EGFR  3. Gastroesophageal reflux disease without esophagitis Avoid spicy foods Do not eat 2 hours prior to bedtime  4. Celiac disease Watch diet  5. Hyperlipidemia with target LDL less than 100 Low fat diet - Lipid panel  6. Iron deficiency anemia, unspecified iron deficiency anemia type Labs pending  7. Osteoporosis without current pathological fracture, unspecified osteoporosis type Weight bearing exercises  8. Acquired hypothyroidism Get back on levothyroxine  9. BMI 27.0-27.9,adult Discussed diet and exercise for person with BMI >25 Will recheck weight in 3-6 months   10. Anxiety Stress management - LORazepam (ATIVAN) 0.5 MG tablet; Take 1 tablet (0.5 mg total) by mouth 2 (two) times daily as needed for anxiety.  Dispense: 30 tablet; Refill: 1   Labs pending Health Maintenance reviewed Diet and exercise encouraged  Follow up plan: 6 months   Mary-Margaret Hassell Done, FNP

## 2021-02-05 LAB — CBC WITH DIFFERENTIAL/PLATELET
Basophils Absolute: 0.1 10*3/uL (ref 0.0–0.2)
Basos: 1 %
EOS (ABSOLUTE): 0.1 10*3/uL (ref 0.0–0.4)
Eos: 2 %
Hematocrit: 43.8 % (ref 34.0–46.6)
Hemoglobin: 15.1 g/dL (ref 11.1–15.9)
Immature Grans (Abs): 0 10*3/uL (ref 0.0–0.1)
Immature Granulocytes: 0 %
Lymphocytes Absolute: 2.4 10*3/uL (ref 0.7–3.1)
Lymphs: 35 %
MCH: 31.8 pg (ref 26.6–33.0)
MCHC: 34.5 g/dL (ref 31.5–35.7)
MCV: 92 fL (ref 79–97)
Monocytes Absolute: 0.7 10*3/uL (ref 0.1–0.9)
Monocytes: 10 %
Neutrophils Absolute: 3.6 10*3/uL (ref 1.4–7.0)
Neutrophils: 52 %
Platelets: 320 10*3/uL (ref 150–450)
RBC: 4.75 x10E6/uL (ref 3.77–5.28)
RDW: 12.3 % (ref 11.7–15.4)
WBC: 6.8 10*3/uL (ref 3.4–10.8)

## 2021-02-05 LAB — LIPID PANEL
Chol/HDL Ratio: 4.4 ratio (ref 0.0–4.4)
Cholesterol, Total: 193 mg/dL (ref 100–199)
HDL: 44 mg/dL (ref >39)
LDL Chol Calc (NIH): 126 mg/dL — ABNORMAL HIGH (ref 0–99)
Triglycerides: 130 mg/dL (ref 0–149)
VLDL Cholesterol Cal: 23 mg/dL (ref 5–40)

## 2021-02-05 LAB — CMP14+EGFR
ALT: 24 IU/L (ref 0–32)
AST: 23 IU/L (ref 0–40)
Albumin/Globulin Ratio: 1.7 (ref 1.2–2.2)
Albumin: 4.2 g/dL (ref 3.8–4.9)
Alkaline Phosphatase: 65 IU/L (ref 44–121)
BUN/Creatinine Ratio: 24 (ref 12–28)
BUN: 18 mg/dL (ref 8–27)
Bilirubin Total: 0.4 mg/dL (ref 0.0–1.2)
CO2: 22 mmol/L (ref 20–29)
Calcium: 9 mg/dL (ref 8.7–10.3)
Chloride: 103 mmol/L (ref 96–106)
Creatinine, Ser: 0.74 mg/dL (ref 0.57–1.00)
Globulin, Total: 2.5 g/dL (ref 1.5–4.5)
Glucose: 109 mg/dL — ABNORMAL HIGH (ref 65–99)
Potassium: 3.8 mmol/L (ref 3.5–5.2)
Sodium: 142 mmol/L (ref 134–144)
Total Protein: 6.7 g/dL (ref 6.0–8.5)
eGFR: 93 mL/min/{1.73_m2} (ref >59)

## 2021-02-05 LAB — VITAMIN D 25 HYDROXY (VIT D DEFICIENCY, FRACTURES): Vit D, 25-Hydroxy: 37.6 ng/mL (ref 30.0–100.0)

## 2021-02-07 NOTE — Telephone Encounter (Signed)
No OTC meds will help- there is a non  statin med called repatha that is an injection. I can refer you to clinical pharmacist to discuss this. It would really help your numbers.

## 2021-02-07 NOTE — Telephone Encounter (Signed)
Please make appointment to see Almyra Free to discuss repatha

## 2021-03-06 ENCOUNTER — Ambulatory Visit: Payer: BC Managed Care – PPO | Admitting: Pharmacist

## 2021-03-18 ENCOUNTER — Ambulatory Visit (INDEPENDENT_AMBULATORY_CARE_PROVIDER_SITE_OTHER): Payer: BC Managed Care – PPO | Admitting: Pharmacist

## 2021-03-18 DIAGNOSIS — E785 Hyperlipidemia, unspecified: Secondary | ICD-10-CM

## 2021-03-18 MED ORDER — EZETIMIBE 10 MG PO TABS: 10.0000 mg | ORAL_TABLET | Freq: Every day | ORAL | 3 refills | Status: DC

## 2021-03-18 NOTE — Progress Notes (Signed)
    03/18/2021 Name: Maria [name] MRN: [id number] DOB: [birth year] HPI:  Maria [name] is a 60 y.o. female patient referred to lipid clinic by PCP. PMH is significant for Past Medical History:  Diagnosis Date   Atypical nevus 10/25/2003   left pinky toe   Atypical nevus 05/03/2018   left outer breast-moderate   BCC (basal cell carcinoma) superficial 05/03/2018   right shoulder superior   Celiac disease    Family history of malignant neoplasm of gastrointestinal tract    Hyperlipidemia    Personal history of colonic polyps 07/27/2008   TUBULAR ADENOMA   SCC (squamous cell carcinoma) in situ x 2 05/03/2018   left chest, left breast inner   Intolerances: statins (liavlo)--documented in allergy list  Risk Factors: family history  LDL goal: <100  Diet: recommended increase fiber; decrease animal fats/sugar   Exercise: does not; encouraged    Family History: parents on cholesterol medicine; siblings   Social History: never smoker; no alcohol   Labs:  Current Outpatient Medications on File Prior to Visit  Medication Sig Dispense Refill   Cyanocobalamin (VITAMIN B 12 PO) Take by mouth.     LORazepam (ATIVAN) 0.5 MG tablet Take 1 tablet (0.5 mg total) by mouth 2 (two) times daily as needed for anxiety. 30 tablet 1   triamterene-hydrochlorothiazide (MAXZIDE-25) 37.5-25 MG tablet Take 1 tablet by mouth daily. 90 tablet 1     Allergies  Allergen Reactions   Claritin [Loratadine] Nausea And Vomiting   Lipid Panel     Component Value Date/Time   CHOL 193 02/04/2021 1013   CHOL 152 11/15/2012 1223   TRIG 130 02/04/2021 1013   TRIG 141 07/19/2014 1040   TRIG 80 11/15/2012 1223   HDL 44 02/04/2021 1013   HDL 39 (L) 07/19/2014 1040   HDL 37 (L) 11/15/2012 1223   CHOLHDL 4.4 02/04/2021 1013   LDLCALC 126 (H) 02/04/2021 1013   LDLCALC 93 11/28/2013 0934   LDLCALC 99 11/15/2012 1223     Assessment/Plan:     1. Hyperlipidemia - STARTING Zetia 1m daily Did not tolerate  statin Dietary/lifestyle modifications  JRegina Eck PharmD, BCPS Clinical Pharmacist, [place] Phone [phone]

## 2021-05-20 ENCOUNTER — Telehealth: Payer: Self-pay | Admitting: Nurse Practitioner

## 2021-05-20 NOTE — Telephone Encounter (Signed)
Can you see what infusion patient is referring to? I only started her on zetia (pill for cholesterol) I'm not sure what she is referring to

## 2021-05-30 NOTE — Telephone Encounter (Signed)
She is not sure. Spoke with patient pretty sure she is referring to repatha injection. Do you know anything about this?

## 2021-08-07 ENCOUNTER — Ambulatory Visit: Payer: BC Managed Care – PPO | Admitting: Nurse Practitioner

## 2021-08-25 ENCOUNTER — Encounter: Payer: Self-pay | Admitting: Nurse Practitioner

## 2021-08-25 ENCOUNTER — Ambulatory Visit: Payer: BC Managed Care – PPO | Admitting: Nurse Practitioner

## 2021-08-25 VITALS — BP 114/77 | HR 92 | Temp 98.6°F | Resp 20 | Ht 67.0 in | Wt 177.0 lb

## 2021-08-25 DIAGNOSIS — K219 Gastro-esophageal reflux disease without esophagitis: Secondary | ICD-10-CM

## 2021-08-25 DIAGNOSIS — Z23 Encounter for immunization: Secondary | ICD-10-CM

## 2021-08-25 DIAGNOSIS — E785 Hyperlipidemia, unspecified: Secondary | ICD-10-CM

## 2021-08-25 DIAGNOSIS — K900 Celiac disease: Secondary | ICD-10-CM | POA: Diagnosis not present

## 2021-08-25 DIAGNOSIS — I10 Essential (primary) hypertension: Secondary | ICD-10-CM

## 2021-08-25 DIAGNOSIS — F419 Anxiety disorder, unspecified: Secondary | ICD-10-CM

## 2021-08-25 DIAGNOSIS — E039 Hypothyroidism, unspecified: Secondary | ICD-10-CM

## 2021-08-25 DIAGNOSIS — M810 Age-related osteoporosis without current pathological fracture: Secondary | ICD-10-CM

## 2021-08-25 DIAGNOSIS — Z6827 Body mass index (BMI) 27.0-27.9, adult: Secondary | ICD-10-CM

## 2021-08-25 MED ORDER — EZETIMIBE 10 MG PO TABS: 10.0000 mg | ORAL_TABLET | Freq: Every day | ORAL | 3 refills | Status: DC

## 2021-08-25 MED ORDER — LORAZEPAM 0.5 MG PO TABS: 0.5000 mg | ORAL_TABLET | Freq: Two times a day (BID) | ORAL | 1 refills | Status: DC | PRN

## 2021-08-25 MED ORDER — TRIAMTERENE-HCTZ 37.5-25 MG PO TABS: 1.0000 | ORAL_TABLET | Freq: Every day | ORAL | 1 refills | Status: DC

## 2021-08-25 MED ORDER — LEVOTHYROXINE SODIUM 75 MCG PO TABS: 75.0000 ug | ORAL_TABLET | Freq: Every day | ORAL | 1 refills | Status: DC

## 2021-08-25 NOTE — Progress Notes (Signed)
? ?Subjective:  ? ? Patient ID: Maria [name], female    DOB: [birth year], 61 y.o.   MRN: [id number] ? ?Chief Complaint: Medical Management of Chronic Issues ?  ? ?HPI: ? ?Maria [name] is a 61 y.o. who identifies as a female who was assigned female at birth.  ? ?Social history: ?Lives with: husband ?Work history: Librarian, academic ? ? ?Comes in today for follow up of the following chronic medical issues: ? ?1. Essential hypertension ?No c/o chest pain, sob or headache. Does not check blood pressure at home. ?BP Readings from Last 3 Encounters:  ?08/25/21 114/77  ?02/04/21 103/69  ?10/31/20 111/64  ? ? ? ?2. Hyperlipidemia with target LDL less than 100 ?Does try to watch diet and does occasional exercise. ?Lab Results  ?Component Value Date  ? CHOL 193 02/04/2021  ? HDL 44 02/04/2021  ? LDLCALC 126 (H) 02/04/2021  ? TRIG 130 02/04/2021  ? CHOLHDL 4.4 02/04/2021  ? ? ? ?3. Gastroesophageal reflux disease without esophagitis ?Is on no prescription meds ? ?4. Celiac disease ?Has celiac disease and really watch her diet for this. ? ?5. Osteoporosis without current pathological fracture, unspecified osteoporosis type ?Last dexascan was [name] 08/24/20. Her t score was -2.4. she does do some weight bearing exercise. ? ?6. BMI 27.0-27.9,adult ?Weight is up 4lbs. ?Wt Readings from Last 3 Encounters:  ?08/25/21 177 lb (80.3 kg)  ?02/04/21 173 lb (78.5 kg)  ?10/31/20 172 lb (78 kg)  ? ?BMI Readings from Last 3 Encounters:  ?08/25/21 27.72 kg/m?  ?02/04/21 27.10 kg/m?  ?10/31/20 26.94 kg/m?  ? ? ? ? ?New complaints: ?None today ? ?Allergies  ?Allergen Reactions  ? Claritin [Loratadine] Nausea And Vomiting  ? Livalo [Pitavastatin]   ?  myopathy  ? ?Outpatient Encounter Medications as of 08/25/2021  ?Medication Sig  ? Cyanocobalamin (VITAMIN B 12 PO) Take by mouth.  ? ezetimibe (ZETIA) 10 MG tablet Take 1 tablet (10 mg total) by mouth daily.  ? levothyroxine (SYNTHROID) 75 MCG tablet Take 75 mcg by mouth daily.  ? LORazepam (ATIVAN) 0.5 MG tablet  Take 1 tablet (0.5 mg total) by mouth 2 (two) times daily as needed for anxiety.  ? triamterene-hydrochlorothiazide (MAXZIDE-25) 37.5-25 MG tablet Take 1 tablet by mouth daily.  ? ?Facility-Administered Encounter Medications as of 08/25/2021  ?Medication  ? betamethasone acetate-betamethasone sodium phosphate (CELESTONE) injection 6 mg  ? ? ?Past Surgical History:  ?Procedure Laterality Date  ? INNER EAR SURGERY    ? TUBAL LIGATION    ? ? ?Family History  ?Problem Relation Age of Onset  ? Colon cancer Father 70  ? COPD Mother   ? Heart disease Mother   ?     MI  ? Diabetes Mother   ? Osteoporosis Sister   ? Cancer Paternal Aunt   ?     breast  ? Breast cancer Maternal Grandmother   ? Esophageal cancer Neg Hx   ? Rectal cancer Neg Hx   ? Stomach cancer Neg Hx   ? ? ? ? ?Controlled substance contract: n/a ? ? ? ? ?Review of Systems  ?Constitutional:  Negative for diaphoresis.  ?Eyes:  Negative for pain.  ?Respiratory:  Negative for shortness of breath.   ?Cardiovascular:  Negative for chest pain, palpitations and leg swelling.  ?Gastrointestinal:  Negative for abdominal pain.  ?Endocrine: Negative for polydipsia.  ?Skin:  Negative for rash.  ?Neurological:  Negative for dizziness, weakness and headaches.  ?Hematological:  Does not bruise/bleed easily.  ?All other  systems reviewed and are negative. ? ?   ?Objective:  ? Physical Exam ?Vitals and nursing note reviewed.  ?Constitutional:   ?   General: She is not in acute distress. ?   Appearance: Normal appearance. She is well-developed.  ?HENT:  ?   Head: Normocephalic.  ?   Right Ear: Tympanic membrane normal.  ?   Left Ear: Tympanic membrane normal.  ?   Nose: Nose normal.  ?   Mouth/Throat:  ?   Mouth: Mucous membranes are moist.  ?Eyes:  ?   Pupils: Pupils are equal, round, and reactive to light.  ?Neck:  ?   Vascular: No carotid bruit or JVD.  ?Cardiovascular:  ?   Rate and Rhythm: Normal rate and regular rhythm.  ?   Heart sounds: Normal heart sounds.  ?Pulmonary:   ?   Effort: Pulmonary effort is normal. No respiratory distress.  ?   Breath sounds: Normal breath sounds. No wheezing or rales.  ?Chest:  ?   Chest wall: No tenderness.  ?Abdominal:  ?   General: Bowel sounds are normal. There is no distension or abdominal bruit.  ?   Palpations: Abdomen is soft. There is no hepatomegaly, splenomegaly, mass or pulsatile mass.  ?   Tenderness: There is no abdominal tenderness.  ?Musculoskeletal:     ?   General: Normal range of motion.  ?   Cervical back: Normal range of motion and neck supple.  ?Lymphadenopathy:  ?   Cervical: No cervical adenopathy.  ?Skin: ?   General: Skin is warm and dry.  ?Neurological:  ?   Mental Status: She is alert and oriented to person, place, and time.  ?   Deep Tendon Reflexes: Reflexes are normal and symmetric.  ?Psychiatric:     ?   Behavior: Behavior normal.     ?   Thought Content: Thought content normal.     ?   Judgment: Judgment normal.  ? ? ?BP 114/77   Pulse 92   Temp 98.6 ?F (37 ?C) (Temporal)   Resp 20   Ht 5' 7"  (1.702 m)   Wt 177 lb (80.3 kg)   SpO2 96%   BMI 27.72 kg/m?  ? ? ? ?   ?Assessment & Plan:  ?Maria [name] comes in today with chief complaint of Medical Management of Chronic Issues ? ? ?Diagnosis and orders addressed: ? ?1. Essential hypertension ?Low sodium diet ?- triamterene-hydrochlorothiazide (MAXZIDE-25) 37.5-25 MG tablet; Take 1 tablet by mouth daily.  Dispense: 90 tablet; Refill: 1 ? ?2. Hyperlipidemia with target LDL less than 100 ?Low fat diet ?- ezetimibe (ZETIA) 10 MG tablet; Take 1 tablet (10 mg total) by mouth daily.  Dispense: 90 tablet; Refill: 3 ? ?3. Gastroesophageal reflux disease without esophagitis ?Avoid spicy foods ?Do not eat 2 hours prior to bedtime ? ?4. Celiac disease ?Watch diet to prevent flare ups ? ?5. Osteoporosis without current pathological fracture, unspecified osteoporosis type ?Weight bearing exercises ? ?6. BMI 27.0-27.9,adult ?Discussed diet and exercise for person with BMI >25 ?Will  recheck weight in 3-6 months ? ? ?7. Anxiety ?Stress management ?- LORazepam (ATIVAN) 0.5 MG tablet; Take 1 tablet (0.5 mg total) by mouth 2 (two) times daily as needed for anxiety.  Dispense: 30 tablet; Refill: 1 ? ?8. Acquired hypothyroidism ?Labs reviewed ?- levothyroxine (SYNTHROID) 75 MCG tablet; Take 1 tablet (75 mcg total) by mouth daily.  Dispense: 90 tablet; Refill: 1 ? ? ?Labs were drawn at work ?Health Maintenance reviewed ?Diet  and exercise encouraged ? ?Follow up plan: ?6 months ? ? ?Maria Hassell Done, FNP ? ? ?

## 2021-08-25 NOTE — Addendum Note (Signed)
Addended by: Rolena Infante on: 08/25/2021 04:40 PM ? ? Modules accepted: Orders ? ?

## 2021-08-25 NOTE — Patient Instructions (Signed)
Celiac Disease ?Celiac disease is a condition in which the body's disease-fighting system (immune system) attacks the small intestine and causes damage. This is known as an autoimmune disease. When a person with celiac disease eats a food that has gluten in it, the immune system attacks the cells that line the small intestine. Over time, this reaction damages the small intestine and makes the small intestine unable to absorb nutrients from food. ?Gluten is found in wheat, rye, and barley, and in foods like pasta, pizza, and cereal. Celiac disease is also known as celiac sprue, nontropical sprue, and gluten-sensitive enteropathy. ?What are the causes? ?This condition is caused by a gene that is passed down through families (inherited). ?What increases the risk? ?You are more likely to develop this condition if you: ?Have a family member with the disease. ?Have an autoimmune condition, such as type 1 diabetes or a thyroid disorder. ?Are female. ?What are the signs or symptoms? ?Symptoms of this condition include: ?Recurring bloating and pain in the abdomen. ?Gas. ?Long-term (chronic) diarrhea. ?Pale, bad-smelling, greasy, or oily stool. ?Weight loss. ?Missed menstrual periods. ?Weakening bones (osteoporosis). ?Fatigue and weakness. ?Tingling or other signs of nerve damage. ?Depression. ?Poor appetite. ?Rash. ?In some cases, there are no symptoms of this condition. ?How is this diagnosed? ?This condition is diagnosed with a physical exam and tests. Tests may include: ?Blood tests to check for nutritional deficiencies. ?Blood tests to look for evidence that the body is attacking cells in the small intestine. ?A test in which a sample of tissue is taken from the small intestine and examined under a microscope (biopsy). ?X-rays of the intestine. ?Stool tests. ?Tests to check for nutrient absorption from the intestine. ?How is this treated? ?There is no cure for this condition, but it may be managed with a gluten-free  diet. ?Treatment may also involve avoiding dairy foods, such as milk and cheese, because they are hard to digest. ?Most people who follow a gluten-free diet feel better and stop having symptoms. ?The intestine usually heals within 3 months to 2 years. ?In a small percentage of people, this condition does not improve on the gluten-free diet. If your condition does not improve, more tests will be done. You will also need to work with a specialist in celiac disease to find the best treatment for you. ?Follow these instructions at home: ? ?Follow instructions from your health care provider about diet. ?Monitor your body's response to the gluten-free diet. Write down any changes in your symptoms and changes in how you feel. ?If you decide to eat outside of the home, prepare your meal ahead of time, or make sure that the place where you are going has gluten-free options. ?Keep all follow-up visits as told by your health care provider. This is important. ?Suggest to family members that they get screened for early signs of the disease. ?Contact a health care provider if: ?You continue to have symptoms, even when you are eating a gluten-free diet. ?You have trouble sticking to the gluten-free diet. ?You develop an itchy rash with groups of tiny blisters. ?You develop severe weakness. ?You develop balance problems. ?You develop new symptoms. ?Summary ?Celiac disease is a condition in which the body's disease-fighting system (immune system) attacks the small intestine and causes damage. ?There is no cure for this condition, but it may be managed with a gluten-free diet and by avoiding dairy foods. ?Follow instructions from your health care provider about diet. Write down any changes in your symptoms and changes in  how you feel. ?Contact a health care provider if you continue to have symptoms, even when you are eating a gluten-free diet, or you have new symptoms. ?Keep all follow-up visits as told by your health care provider.  This is important. ?This information is not intended to replace advice given to you by your health care provider. Make sure you discuss any questions you have with your health care provider. ?Document Revised: 07/26/2019 Document Reviewed: 10/20/2017 ?Elsevier Patient Education ? [place]. ? ?

## 2021-09-15 ENCOUNTER — Telehealth: Payer: BC Managed Care – PPO | Admitting: Nurse Practitioner

## 2021-09-15 DIAGNOSIS — R059 Cough, unspecified: Secondary | ICD-10-CM | POA: Diagnosis not present

## 2021-09-15 DIAGNOSIS — J0100 Acute maxillary sinusitis, unspecified: Secondary | ICD-10-CM | POA: Diagnosis not present

## 2021-09-25 ENCOUNTER — Encounter: Payer: Self-pay | Admitting: Family Medicine

## 2021-09-25 ENCOUNTER — Ambulatory Visit: Payer: BC Managed Care – PPO | Admitting: Family Medicine

## 2021-09-25 VITALS — BP 119/70 | HR 90 | Temp 97.7°F | Ht 67.0 in | Wt 174.4 lb

## 2021-09-25 DIAGNOSIS — M10072 Idiopathic gout, left ankle and foot: Secondary | ICD-10-CM

## 2021-09-25 MED ORDER — PREDNISONE 10 MG (21) PO TBPK: ORAL_TABLET | ORAL | 0 refills | Status: DC

## 2021-09-25 MED ORDER — METHYLPREDNISOLONE ACETATE 80 MG/ML IJ SUSP
80.0000 mg | Freq: Once | INTRAMUSCULAR | Status: AC
  Administered 2021-09-25: 80 mg via INTRAMUSCULAR

## 2021-09-25 NOTE — Progress Notes (Signed)
? ?Assessment & Plan:  ?1. Acute idiopathic gout involving toe of left foot ?Education provided on gout. Note provided to be out of work tomorrow. Advised to call back tomorrow afternoon if she does not feel she can work the weekend so that her note can be extended.  ?- predniSONE (STERAPRED UNI-PAK 21 TAB) 10 MG (21) TBPK tablet; As directed x 6 days  Dispense: 21 tablet; Refill: 0 ?- methylPREDNISolone acetate (DEPO-MEDROL) injection 80 mg ? ? ?Follow up plan: Return if symptoms worsen or fail to improve. ? ?Hendricks Limes, MSN, APRN, FNP-C ?[place] ? ?Subjective:  ? ?Patient ID: Maria [name], female    DOB: 11-Oct-1960, 61 y.o.   MRN: [id number] ? ?HPI: ?Maria [name] is a 61 y.o. female presenting on 09/25/2021 for Foot Pain (Left on and off x 2 weeks and started back x 2 days ago.  Can not put weight on foot) ? ?Patient reports left foot pain x2 weeks. She reports initially the pain was on and off, but for the past two days it has been constant. She has been doing more walking recently and reports it increases the pain. She describes the pain as stabbing and throbbing. She rates the pain 6-7/10 on average, but states it was 12/10 last night. She has been taking Ibuprofen and Tylenol for the pain which is effective. She is scheduled to return to work tomorrow where she is on her feet the entire shirt on concrete floors. She does not feel she will be able to work due to the pain.  ? ? ?ROS: Negative unless specifically indicated above in HPI.  ? ?Relevant past medical history reviewed and updated as indicated.  ? ?Allergies and medications reviewed and updated. ? ? ?Current Outpatient Medications:  ?  Cyanocobalamin (VITAMIN B 12 PO), Take by mouth., Disp: , Rfl:  ?  ezetimibe (ZETIA) 10 MG tablet, Take 1 tablet (10 mg total) by mouth daily., Disp: 90 tablet, Rfl: 3 ?  levothyroxine (SYNTHROID) 75 MCG tablet, Take 1 tablet (75 mcg total) by mouth daily., Disp: 90 tablet, Rfl: 1 ?  LORazepam  (ATIVAN) 0.5 MG tablet, Take 1 tablet (0.5 mg total) by mouth 2 (two) times daily as needed for anxiety., Disp: 30 tablet, Rfl: 1 ?  triamterene-hydrochlorothiazide (MAXZIDE-25) 37.5-25 MG tablet, Take 1 tablet by mouth daily., Disp: 90 tablet, Rfl: 1 ? ?Current Facility-Administered Medications:  ?  betamethasone acetate-betamethasone sodium phosphate (CELESTONE) injection 6 mg, 6 mg, Intramuscular, Once, Claretta Fraise, MD ? ?Allergies  ?Allergen Reactions  ? Claritin [Loratadine] Nausea And Vomiting  ? Livalo [Pitavastatin]   ?  myopathy  ? ? ?Objective:  ? ?BP 119/70   Pulse 90   Temp 97.7 ?F (36.5 ?C) (Temporal)   Ht 5' 7"  (1.702 m)   Wt 174 lb 6.4 oz (79.1 kg)   SpO2 97%   BMI 27.31 kg/m?   ? ?Physical Exam ?Vitals reviewed.  ?Constitutional:   ?   General: She is not in acute distress. ?   Appearance: Normal appearance. She is not ill-appearing, toxic-appearing or diaphoretic.  ?HENT:  ?   Head: Normocephalic and atraumatic.  ?Eyes:  ?   General: No scleral icterus.    ?   Right eye: No discharge.     ?   Left eye: No discharge.  ?   Conjunctiva/sclera: Conjunctivae normal.  ?Cardiovascular:  ?   Rate and Rhythm: Normal rate.  ?Pulmonary:  ?   Effort: Pulmonary effort is normal. No respiratory  distress.  ?Musculoskeletal:     ?   General: Normal range of motion.  ?   Cervical back: Normal range of motion.  ?Feet:  ?   Left foot:  ?   Skin integrity: Erythema (1st MTP joint) and warmth (1st MTP joint) present.  ?   Toenail Condition: Left toenails are normal.  ?Skin: ?   General: Skin is warm and dry.  ?   Capillary Refill: Capillary refill takes less than 2 seconds.  ?Neurological:  ?   General: No focal deficit present.  ?   Mental Status: She is alert and oriented to person, place, and time. Mental status is at baseline.  ?Psychiatric:     ?   Mood and Affect: Mood normal.     ?   Behavior: Behavior normal.     ?   Thought Content: Thought content normal.     ?   Judgment: Judgment normal.   ? ? ? ? ? ? ?

## 2021-10-09 DIAGNOSIS — M10071 Idiopathic gout, right ankle and foot: Secondary | ICD-10-CM | POA: Diagnosis not present

## 2021-10-09 DIAGNOSIS — B351 Tinea unguium: Secondary | ICD-10-CM | POA: Diagnosis not present

## 2021-10-09 DIAGNOSIS — M79671 Pain in right foot: Secondary | ICD-10-CM | POA: Diagnosis not present

## 2021-12-23 ENCOUNTER — Ambulatory Visit: Payer: BC Managed Care – PPO | Admitting: Nurse Practitioner

## 2021-12-23 ENCOUNTER — Encounter: Payer: Self-pay | Admitting: Nurse Practitioner

## 2021-12-23 VITALS — BP 110/72 | HR 78 | Temp 97.9°F | Resp 20 | Ht 67.0 in | Wt 173.0 lb

## 2021-12-23 DIAGNOSIS — K146 Glossodynia: Secondary | ICD-10-CM | POA: Diagnosis not present

## 2021-12-23 DIAGNOSIS — R6889 Other general symptoms and signs: Secondary | ICD-10-CM | POA: Diagnosis not present

## 2021-12-23 NOTE — Progress Notes (Signed)
Subjective:    Patient ID: Maria [name], female    DOB: 1961-05-31, 61 y.o.   MRN: [id number]    Chief Complaint: fatigue  HPI Patient comes in today c/o fatigue. And mouth burning She would like to have her b12 levels checked. She says she feels tired all the time. She takes B12 orally daily. Lab Results  Component Value Date   VITAMINB12 >2000 (H) 10/03/2020    Saw Dt. Drake for gout a few months ago and he gave her mobic which helped. She needs refill.  Review of Systems  Constitutional:  Negative for diaphoresis.  Eyes:  Negative for pain.  Respiratory:  Negative for shortness of breath.   Cardiovascular:  Negative for chest pain, palpitations and leg swelling.  Gastrointestinal:  Negative for abdominal pain.  Endocrine: Negative for polydipsia.  Skin:  Negative for rash.  Neurological:  Negative for dizziness, weakness and headaches.  Hematological:  Does not bruise/bleed easily.  All other systems reviewed and are negative.      Objective:   Physical Exam Vitals and nursing note reviewed.  Constitutional:      General: She is not in acute distress.    Appearance: Normal appearance. She is well-developed.  HENT:     Head: Normocephalic.     Right Ear: Tympanic membrane normal.     Left Ear: Tympanic membrane normal.     Nose: Nose normal.     Mouth/Throat:     Mouth: Mucous membranes are moist.  Eyes:     Pupils: Pupils are equal, round, and reactive to light.  Neck:     Vascular: No carotid bruit or JVD.  Cardiovascular:     Rate and Rhythm: Normal rate and regular rhythm.     Heart sounds: Normal heart sounds.  Pulmonary:     Effort: Pulmonary effort is normal. No respiratory distress.     Breath sounds: Normal breath sounds. No wheezing or rales.  Chest:     Chest wall: No tenderness.  Abdominal:     General: Bowel sounds are normal. There is no distension or abdominal bruit.     Palpations: Abdomen is soft. There is no hepatomegaly, splenomegaly,  mass or pulsatile mass.     Tenderness: There is no abdominal tenderness.  Musculoskeletal:        General: Normal range of motion.     Cervical back: Normal range of motion and neck supple.  Lymphadenopathy:     Cervical: No cervical adenopathy.  Skin:    General: Skin is warm and dry.  Neurological:     Mental Status: She is alert and oriented to person, place, and time.     Deep Tendon Reflexes: Reflexes are normal and symmetric.  Psychiatric:        Behavior: Behavior normal.        Thought Content: Thought content normal.        Judgment: Judgment normal.    BP 110/72   Pulse 78   Temp 97.9 F (36.6 C) (Temporal)   Resp 20   Ht 5' 7"  (1.702 m)   Wt 173 lb (78.5 kg)   SpO2 96%   BMI 27.10 kg/m         Assessment & Plan:  Maria [name] in today with chief complaint of Mouth is burning (Going on for 5 years. Wants B12 levels checked/) and Bilateral ankles swelling   1. Tongue burning sensation Will investigate this and call - Anemia Profile B - Vitamin B12  The above assessment and management plan was discussed with the patient. The patient verbalized understanding of and has agreed to the management plan. Patient is aware to call the clinic if symptoms persist or worsen. Patient is aware when to return to the clinic for a follow-up visit. Patient educated on when it is appropriate to go to the emergency department.   Maria [name] Done, FNP

## 2021-12-24 LAB — ANEMIA PROFILE B
Basophils Absolute: 0.1 10*3/uL (ref 0.0–0.2)
Basos: 1 %
EOS (ABSOLUTE): 0.2 10*3/uL (ref 0.0–0.4)
Eos: 2 %
Ferritin: 54 ng/mL (ref 15–150)
Folate: 10.9 ng/mL (ref >3.0)
Hematocrit: 42.9 % (ref 34.0–46.6)
Hemoglobin: 14.9 g/dL (ref 11.1–15.9)
Immature Grans (Abs): 0 10*3/uL (ref 0.0–0.1)
Immature Granulocytes: 0 %
Iron Saturation: 29 % (ref 15–55)
Iron: 98 ug/dL (ref 27–139)
Lymphocytes Absolute: 2.8 10*3/uL (ref 0.7–3.1)
Lymphs: 27 %
MCH: 32.4 pg (ref 26.6–33.0)
MCHC: 34.7 g/dL (ref 31.5–35.7)
MCV: 93 fL (ref 79–97)
Monocytes Absolute: 1 10*3/uL — ABNORMAL HIGH (ref 0.1–0.9)
Monocytes: 10 %
Neutrophils Absolute: 6.4 10*3/uL (ref 1.4–7.0)
Neutrophils: 60 %
Platelets: 334 10*3/uL (ref 150–450)
RBC: 4.6 x10E6/uL (ref 3.77–5.28)
RDW: 12.6 % (ref 11.7–15.4)
Retic Ct Pct: 1.9 % (ref 0.6–2.6)
Total Iron Binding Capacity: 339 ug/dL (ref 250–450)
UIBC: 241 ug/dL (ref 118–369)
Vitamin B-12: >2000 pg/mL — ABNORMAL HIGH (ref 232–1245)
WBC: 10.6 10*3/uL (ref 3.4–10.8)

## 2022-02-09 ENCOUNTER — Other Ambulatory Visit: Payer: Self-pay | Admitting: Nurse Practitioner

## 2022-02-09 DIAGNOSIS — I10 Essential (primary) hypertension: Secondary | ICD-10-CM

## 2022-02-26 ENCOUNTER — Encounter: Payer: Self-pay | Admitting: Nurse Practitioner

## 2022-02-26 ENCOUNTER — Ambulatory Visit: Payer: BC Managed Care – PPO | Admitting: Nurse Practitioner

## 2022-02-26 VITALS — BP 105/63 | HR 75 | Temp 98.2°F | Resp 20 | Ht 67.0 in | Wt 173.0 lb

## 2022-02-26 DIAGNOSIS — Z1211 Encounter for screening for malignant neoplasm of colon: Secondary | ICD-10-CM

## 2022-02-26 DIAGNOSIS — K900 Celiac disease: Secondary | ICD-10-CM | POA: Diagnosis not present

## 2022-02-26 DIAGNOSIS — E039 Hypothyroidism, unspecified: Secondary | ICD-10-CM

## 2022-02-26 DIAGNOSIS — F419 Anxiety disorder, unspecified: Secondary | ICD-10-CM

## 2022-02-26 DIAGNOSIS — E785 Hyperlipidemia, unspecified: Secondary | ICD-10-CM | POA: Diagnosis not present

## 2022-02-26 DIAGNOSIS — K219 Gastro-esophageal reflux disease without esophagitis: Secondary | ICD-10-CM

## 2022-02-26 DIAGNOSIS — Z1212 Encounter for screening for malignant neoplasm of rectum: Secondary | ICD-10-CM

## 2022-02-26 DIAGNOSIS — I10 Essential (primary) hypertension: Secondary | ICD-10-CM | POA: Diagnosis not present

## 2022-02-26 DIAGNOSIS — Z23 Encounter for immunization: Secondary | ICD-10-CM | POA: Diagnosis not present

## 2022-02-26 DIAGNOSIS — M810 Age-related osteoporosis without current pathological fracture: Secondary | ICD-10-CM

## 2022-02-26 DIAGNOSIS — Z6827 Body mass index (BMI) 27.0-27.9, adult: Secondary | ICD-10-CM

## 2022-02-26 MED ORDER — LORAZEPAM 0.5 MG PO TABS: 0.5000 mg | ORAL_TABLET | Freq: Two times a day (BID) | ORAL | 1 refills | Status: DC | PRN

## 2022-02-26 MED ORDER — EZETIMIBE 10 MG PO TABS: 10.0000 mg | ORAL_TABLET | Freq: Every day | ORAL | 1 refills | Status: DC

## 2022-02-26 MED ORDER — LEVOTHYROXINE SODIUM 75 MCG PO TABS: 75.0000 ug | ORAL_TABLET | Freq: Every day | ORAL | 1 refills | Status: DC

## 2022-02-26 MED ORDER — TRIAMTERENE-HCTZ 37.5-25 MG PO TABS: 1.0000 | ORAL_TABLET | Freq: Every day | ORAL | 1 refills | Status: DC

## 2022-02-26 NOTE — Progress Notes (Signed)
Subjective:    Patient ID: Maria [name], female    DOB: Jul 11, 1960, 61 y.o.   MRN: [id number]   Chief Complaint: Medical management of chronic issues.   HPI:  Maria [name] is a 61 y.o. who identifies as a female who was assigned female at birth.   Social history: Lives with: husband Work history: Librarian, academic   Comes in today for follow up of the following chronic medical issues:  1. Essential hypertension No c/o chest pain, sob, headache. Does not check BP at home. BP Readings from Last 3 Encounters:  12/23/21 110/72  09/25/21 119/70  08/25/21 114/77     2. Hyperlipidemia with target LDL less than 100 Does try to watch her diet and occasionally exercises. Lab Results  Component Value Date   CHOL 193 02/04/2021   HDL 44 02/04/2021   LDLCALC 126 (H) 02/04/2021   TRIG 130 02/04/2021   CHOLHDL 4.4 02/04/2021     3. Gastroesophageal reflux disease without esophagitis Not currently on any prescription meds.   4. Celiac disease Has celiac disease; really tries to watch her diet; occasionally eats gluten/wheat; sometimes she has episodes of diarrhea and sometimes these foods do not bother her.  5. Osteoporosis without current pathological fracture, unspecified osteoporosis type Last dexascan was done 08/20/20; t-score was -2.4; she does do some weight bearing exercises.  6. BMI 27.0-27.9,adult No recent weight changes. Wt Readings from Last 3 Encounters:  12/23/21 173 lb (78.5 kg)  09/25/21 174 lb 6.4 oz (79.1 kg)  08/25/21 177 lb (80.3 kg)   BMI Readings from Last 3 Encounters:  12/23/21 27.10 kg/m  09/25/21 27.31 kg/m  08/25/21 27.72 kg/m      New complaints: None today.  Allergies  Allergen Reactions   Claritin [Loratadine] Nausea And Vomiting   Livalo [Pitavastatin]     myopathy   Outpatient Encounter Medications as of 02/26/2022  Medication Sig   Cyanocobalamin (VITAMIN B 12 PO) Take by mouth.   ezetimibe (ZETIA) 10 MG tablet Take 1 tablet (10 mg  total) by mouth daily.   levothyroxine (SYNTHROID) 75 MCG tablet Take 1 tablet (75 mcg total) by mouth daily.   LORazepam (ATIVAN) 0.5 MG tablet Take 1 tablet (0.5 mg total) by mouth 2 (two) times daily as needed for anxiety.   meloxicam (MOBIC) 15 MG tablet Take 15 mg by mouth daily.   triamterene-hydrochlorothiazide (MAXZIDE-25) 37.5-25 MG tablet Take 1 tablet by mouth once daily   Facility-Administered Encounter Medications as of 02/26/2022  Medication   betamethasone acetate-betamethasone sodium phosphate (CELESTONE) injection 6 mg    Past Surgical History:  Procedure Laterality Date   INNER EAR SURGERY     TUBAL LIGATION      Family History  Problem Relation Age of Onset   Colon cancer Father 66   COPD Mother    Heart disease Mother        MI   Diabetes Mother    Osteoporosis Sister    Cancer Paternal Aunt        breast   Breast cancer Maternal Grandmother    Esophageal cancer Neg Hx    Rectal cancer Neg Hx    Stomach cancer Neg Hx       Controlled substance contract: n/a     Review of Systems  Constitutional:  Negative for appetite change, fatigue and unexpected weight change.  HENT:  Negative for ear pain and trouble swallowing.   Eyes:  Negative for pain.  Cardiovascular:  Negative for chest pain,  palpitations and leg swelling.  Gastrointestinal:  Positive for diarrhea. Negative for abdominal pain.       Occasional diarrhea when she eats gluten/wheat.  Endocrine: Negative.   Genitourinary:  Negative for difficulty urinating.  Musculoskeletal:  Negative for arthralgias, joint swelling and myalgias.  Skin:  Negative for rash.  Allergic/Immunologic: Negative.   Neurological:  Negative for dizziness, weakness and light-headedness.  Hematological:  Negative for adenopathy. Does not bruise/bleed easily.  Psychiatric/Behavioral: Negative.    All other systems reviewed and are negative.      Objective:   Physical Exam Vitals and nursing note reviewed.   Constitutional:      General: She is not in acute distress.    Appearance: Normal appearance. She is well-developed and well-groomed.  HENT:     Head: Normocephalic and atraumatic.     Right Ear: Tympanic membrane, ear canal and external ear normal.     Left Ear: Tympanic membrane, ear canal and external ear normal.     Nose: Nose normal.     Mouth/Throat:     Mouth: Mucous membranes are moist.     Pharynx: Oropharynx is clear.  Eyes:     Conjunctiva/sclera: Conjunctivae normal.     Pupils: Pupils are equal, round, and reactive to light.  Neck:     Thyroid: No thyroid mass or thyroid tenderness.     Vascular: No carotid bruit or JVD.  Cardiovascular:     Rate and Rhythm: Normal rate and regular rhythm.     Pulses: Normal pulses.     Heart sounds: Normal heart sounds.  Pulmonary:     Effort: Pulmonary effort is normal.     Breath sounds: Normal breath sounds. No wheezing or rhonchi.  Abdominal:     General: Bowel sounds are normal.     Palpations: Abdomen is soft. There is no mass.     Tenderness: There is no abdominal tenderness.  Musculoskeletal:        General: No swelling or tenderness. Normal range of motion.     Cervical back: Normal range of motion.  Lymphadenopathy:     Cervical: No cervical adenopathy.  Skin:    General: Skin is warm and dry.     Capillary Refill: Capillary refill takes less than 2 seconds.  Neurological:     General: No focal deficit present.     Mental Status: She is alert and oriented to person, place, and time.  Psychiatric:        Mood and Affect: Mood normal.        Behavior: Behavior normal.        Thought Content: Thought content normal.        Judgment: Judgment normal.    BP 105/63   Pulse 75   Temp 98.2 F (36.8 C) (Temporal)   Resp 20   Ht 5' 7"  (1.702 m)   Wt 173 lb (78.5 kg)   SpO2 98%   BMI 27.10 kg/m         Assessment & Plan:  Maria [name] comes in today with chief complaint of Medical Management of Chronic  Issues   Diagnosis and orders addressed:  1. Essential hypertension Low sodium diet - triamterene-hydrochlorothiazide (MAXZIDE-25) 37.5-25 MG tablet; Take 1 tablet by mouth daily.  Dispense: 90 tablet; Refill: 1 - CBC with Differential/Platelet - CMP14+EGFR  2. Hyperlipidemia with target LDL less than 100 Low fat diet - ezetimibe (ZETIA) 10 MG tablet; Take 1 tablet (10 mg total) by mouth daily.  Dispense: 90 tablet; Refill: 1 - Lipid panel  3. Gastroesophageal reflux disease without esophagitis Avoid spicy foods Do not eat 2 hours prior to bedtime   4. Celiac disease Watch diet  5. Osteoporosis without current pathological fracture, unspecified osteoporosis type Weight bearing exercises  6. BMI 27.0-27.9,adult Discussed diet and exercise for person with BMI >25 Will recheck weight in 3-6 months   7. Acquired hypothyroidism Labs pending stress management - levothyroxine (SYNTHROID) 75 MCG tablet; Take 1 tablet (75 mcg total) by mouth daily.  Dispense: 90 tablet; Refill: 1 - Thyroid Panel With TSH  8. Anxiety Stress management - LORazepam (ATIVAN) 0.5 MG tablet; Take 1 tablet (0.5 mg total) by mouth 2 (two) times daily as needed for anxiety.  Dispense: 30 tablet; Refill: 1  9. Screening for colorectal cancer - Ambulatory referral to Gastroenterology   Labs pending Health Maintenance reviewed Diet and exercise encouraged  Follow up plan: 6 months   [place], [place], FNP student

## 2022-02-26 NOTE — Patient Instructions (Signed)
Bone Health Bones protect organs, store calcium, anchor muscles, and support the whole body. Keeping your bones strong is important, especially as you get older. You can take actions to help keep your bones strong and healthy. Why is keeping my bones healthy important?  Keeping your bones healthy is important because your body constantly replaces bone cells. Cells get old, and new cells take their place. As we age, we lose bone cells because the body may not be able to make enough new cells to replace the old cells. The amount of bone cells and bone tissue you have is referred to as bone mass. The higher your bone mass, the stronger your bones. The aging process leads to an overall loss of bone mass in the body, which can increase the likelihood of: Broken bones. A condition in which the bones become weak and brittle (osteoporosis). A large decline in bone mass occurs in older adults. In women, it occurs about the time of menopause. What actions can I take to keep my bones healthy? Good health habits are important for maintaining healthy bones. This includes eating nutritious foods and exercising regularly. To have healthy bones, you need to get enough of the right minerals and vitamins. Most nutrition experts recommend getting these nutrients from the foods that you eat. In some cases, taking supplements may also be recommended. Doing certain types of exercise is also important for bone health. What are the nutritional recommendations for healthy bones?  Eating a well-balanced diet with plenty of calcium and vitamin D will help to protect your bones. Nutritional recommendations vary from person to person. Ask your health care provider what is healthy for you. Here are some general guidelines. Get enough calcium Calcium is the most important (essential) mineral for bone health. Most people can get enough calcium from their diet, but supplements may be recommended for people who are at risk for  osteoporosis. Good sources of calcium include: Dairy products, such as low-fat or nonfat milk, cheese, and yogurt. Dark green leafy vegetables, such as bok choy and broccoli. Foods that have calcium added to them (are fortified). Foods that may be fortified with calcium include orange juice, cereal, bread, soy beverages, and tofu products. Nuts, such as almonds. Follow these recommended amounts for daily calcium intake: Infants, 0-6 months: 200 mg. Infants, 6-12 months: 260 mg. Children, age 655-3: 700 mg. Children, age 65-8: 1,000 mg. Children, age 651-13: 1,300 mg. Teens, age 94-18: 1,300 mg. Adults, age 70-50: 1,000 mg. Adults, age 655-70: Men: 1,000 mg. Women: 1,200 mg. Adults, age 52 or older: 1,200 mg. Pregnant and breastfeeding females: Teens: 1,300 mg. Adults: 1,000 mg. Get enough vitamin D Vitamin D is the most essential vitamin for bone health. It helps the body absorb calcium. Sunlight stimulates the skin to make vitamin D, so be sure to get enough sunlight. If you live in a cold climate or you do not get outside often, your health care provider may recommend that you take vitamin D supplements. Good sources of vitamin D in your diet include: Egg yolks. Saltwater fish. Milk and cereal fortified with vitamin D. Follow these recommended amounts for daily vitamin D intake: Infants, 0-12 months: 400 international units (IU). Children and teens, age 655-18: 64 international units. Adults, age 27 or younger: 13 international units. Adults, age 37 or older: 16-1,000 international units. Get other important nutrients Other nutrients that are important for bone health include: Phosphorus. This mineral is found in meat, poultry, dairy foods, nuts, and legumes. The  recommended daily intake for adult men and adult women is 700 mg. Magnesium. This mineral is found in seeds, nuts, dark green vegetables, and legumes. The recommended daily intake for adult men is 400-420 mg. For adult women,  it is 310-320 mg. Vitamin K. This vitamin is found in green leafy vegetables. The recommended daily intake is 120 mcg for adult men and 90 mcg for adult women. What type of physical activity is best for building and maintaining healthy bones? Weight-bearing and strength-building activities are important for building and maintaining healthy bones. Weight-bearing activities cause muscles and bones to work against gravity. Strength-building activities increase the strength of the muscles that support bones. Weight-bearing and muscle-building activities include: Walking and hiking. Jogging and running. Dancing. Gym exercises. Lifting weights. Tennis and racquetball. Climbing stairs. Aerobics. Adults should get at least 30 minutes of moderate physical activity on most days. Children should get at least 60 minutes of moderate physical activity on most days. Ask your health care provider what type of exercise is best for you. How can I find out if my bone mass is low? Bone mass can be measured with an X-ray test called a bone mineral density (BMD) test. This test is recommended for all women who are age 47 or older. It may also be recommended for: Men who are age 68 or older. People who are at risk for osteoporosis because of: Having a long-term disease that weakens bones, such as kidney disease or rheumatoid arthritis. Having menopause earlier than normal. Taking medicine that weakens bones, such as steroids, thyroid hormones, or hormone treatment for breast cancer or prostate cancer. Smoking. Drinking three or more alcoholic drinks a day. Being underweight. Sedentary lifestyle. If you find that you have a low bone mass, you may be able to prevent osteoporosis or further bone loss by changing your diet and lifestyle. Where can I find more information? Bone Health & Osteoporosis Foundation: [employer] of Health: www.bones.[employer].es International Osteoporosis  Foundation: Administrator.iofbonehealth.org Summary The aging process leads to an overall loss of bone mass in the body, which can increase the likelihood of broken bones and osteoporosis. Eating a well-balanced diet with plenty of calcium and vitamin D will help to protect your bones. Weight-bearing and strength-building activities are also important for building and maintaining strong bones. Bone mass can be measured with an X-ray test called a bone mineral density (BMD) test. This information is not intended to replace advice given to you by your health care provider. Make sure you discuss any questions you have with your health care provider. Document Revised: 11/13/2020 Document Reviewed: 11/13/2020 Elsevier Patient Education  [place].

## 2022-02-27 LAB — CBC WITH DIFFERENTIAL/PLATELET
Basophils Absolute: 0.1 10*3/uL (ref 0.0–0.2)
Basos: 1 %
EOS (ABSOLUTE): 0.4 10*3/uL (ref 0.0–0.4)
Eos: 4 %
Hematocrit: 44.4 % (ref 34.0–46.6)
Hemoglobin: 15.1 g/dL (ref 11.1–15.9)
Immature Grans (Abs): 0 10*3/uL (ref 0.0–0.1)
Immature Granulocytes: 0 %
Lymphocytes Absolute: 3.1 10*3/uL (ref 0.7–3.1)
Lymphs: 34 %
MCH: 32.1 pg (ref 26.6–33.0)
MCHC: 34 g/dL (ref 31.5–35.7)
MCV: 94 fL (ref 79–97)
Monocytes Absolute: 0.9 10*3/uL (ref 0.1–0.9)
Monocytes: 10 %
Neutrophils Absolute: 4.8 10*3/uL (ref 1.4–7.0)
Neutrophils: 51 %
Platelets: 323 10*3/uL (ref 150–450)
RBC: 4.71 x10E6/uL (ref 3.77–5.28)
RDW: 11.8 % (ref 11.7–15.4)
WBC: 9.3 10*3/uL (ref 3.4–10.8)

## 2022-02-27 LAB — THYROID PANEL WITH TSH
Free Thyroxine Index: 1.5 (ref 1.2–4.9)
T3 Uptake Ratio: 20 % — ABNORMAL LOW (ref 24–39)
T4, Total: 7.6 ug/dL (ref 4.5–12.0)
TSH: 1.24 u[IU]/mL (ref 0.450–4.500)

## 2022-02-27 LAB — CMP14+EGFR
ALT: 28 IU/L (ref 0–32)
AST: 27 IU/L (ref 0–40)
Albumin/Globulin Ratio: 1.5 (ref 1.2–2.2)
Albumin: 4.1 g/dL (ref 3.9–4.9)
Alkaline Phosphatase: 100 IU/L (ref 44–121)
BUN/Creatinine Ratio: 18 (ref 12–28)
BUN: 14 mg/dL (ref 8–27)
Bilirubin Total: 0.2 mg/dL (ref 0.0–1.2)
CO2: 25 mmol/L (ref 20–29)
Calcium: 9.1 mg/dL (ref 8.7–10.3)
Chloride: 101 mmol/L (ref 96–106)
Creatinine, Ser: 0.77 mg/dL (ref 0.57–1.00)
Globulin, Total: 2.7 g/dL (ref 1.5–4.5)
Glucose: 100 mg/dL — ABNORMAL HIGH (ref 70–99)
Potassium: 4 mmol/L (ref 3.5–5.2)
Sodium: 140 mmol/L (ref 134–144)
Total Protein: 6.8 g/dL (ref 6.0–8.5)
eGFR: 87 mL/min/{1.73_m2} (ref >59)

## 2022-02-27 LAB — LIPID PANEL
Chol/HDL Ratio: 3.8 ratio (ref 0.0–4.4)
Cholesterol, Total: 162 mg/dL (ref 100–199)
HDL: 43 mg/dL (ref >39)
LDL Chol Calc (NIH): 90 mg/dL (ref 0–99)
Triglycerides: 166 mg/dL — ABNORMAL HIGH (ref 0–149)
VLDL Cholesterol Cal: 29 mg/dL (ref 5–40)

## 2022-03-02 ENCOUNTER — Telehealth: Payer: Self-pay | Admitting: Gastroenterology

## 2022-03-02 NOTE — Telephone Encounter (Signed)
Supervising MD 03/02/2022 AM    Hi Dr. Candis Schatz,      We received a referral for this patient to get a colonoscopy with [place]. Patient had GI hx with [facility] back in 2016. Records are in Epic for you to review. Please advise on scheduling.      Thank you

## 2022-03-04 NOTE — Telephone Encounter (Signed)
Hi Dr. Candis Schatz  I called patient to schedule, she did not pick up. I left a detailed voice message for her to call back.   Thank you

## 2022-03-11 ENCOUNTER — Encounter: Payer: Self-pay | Admitting: Gastroenterology

## 2022-03-11 NOTE — Telephone Encounter (Signed)
Hi Dr. Candis Schatz,   Patient is scheduled for 11/8. States she would like to see you in the office first.    Thank you

## 2022-04-22 ENCOUNTER — Ambulatory Visit: Payer: BC Managed Care – PPO | Admitting: Gastroenterology

## 2022-04-22 ENCOUNTER — Encounter: Payer: Self-pay | Admitting: Gastroenterology

## 2022-04-22 ENCOUNTER — Other Ambulatory Visit: Payer: BC Managed Care – PPO

## 2022-04-22 VITALS — BP 100/68 | HR 76 | Ht 67.0 in | Wt 174.1 lb

## 2022-04-22 DIAGNOSIS — K900 Celiac disease: Secondary | ICD-10-CM | POA: Diagnosis not present

## 2022-04-22 DIAGNOSIS — Z800 Family history of malignant neoplasm of digestive organs: Secondary | ICD-10-CM | POA: Diagnosis not present

## 2022-04-22 DIAGNOSIS — Z86010 Personal history of colonic polyps: Secondary | ICD-10-CM

## 2022-04-22 MED ORDER — SUTAB 1479-225-188 MG PO TABS: 1.0000 | ORAL_TABLET | Freq: Once | ORAL | 0 refills | Status: AC

## 2022-04-22 MED ORDER — NA SULFATE-K SULFATE-MG SULF 17.5-3.13-1.6 GM/177ML PO SOLN: 1.0000 | Freq: Once | ORAL | 0 refills | Status: AC

## 2022-04-22 NOTE — Progress Notes (Unsigned)
HPI : Maria [name] is a very pleasant 61 year old female with a history of celiac disease and family history of colon cancer who was referred to [place] for consideration of repeat colonoscopy.  She was previously followed by [facility], but was seen by [facility] for some time, but wishes to switch back to [facility].  Her father was diagnosed with colon cancer at age 50.  She reports undergoing many colonoscopies, starting about 20 years ago.  She has polyps on multiple colonoscopies.  I have access to colonoscopy reports from 2005-2016, which did showed 5 small polyps collectively between 4 colonoscopies.  Her last colonoscopy was in 2016 and no polyps were found. She also has a history of celiac disease, diagnosed in 2005 via EGD.  TTG IgA titers were elevated in 2008.  She followed a strict gluten free diet for a long time, but started relaxing her diet over a period of years, and now she says she has been following a regular diet for well over a year now.  She denies having any bothersome GI symptoms since abandoning the gluten free diet.  She recalls having issues with diarrhea in the in past but denies ever having problems with abdominal pain.  The indication listed for the EGD in which she was diagnosed with celiac disease was "chest pain, reflux symptoms".  She was noted to have iron deficiency anemia in 2008.  She has not had a repeat EGD since her diagnosis. She reports a history of B12 deficiency with symptoms of glossitis which resolve with B12 oral supplementation. She currently reports having 1-2 bowel movements per day, with stools that are frequently loose or poorly formed.  She has mild bloating and gas/flatulence.  No nausea or vomiting.  No abdominal pain.  Iron and B12 levels were recently checked by her PCP and were normal (B12 level well above normal limits)  No history of DH or oral aphtha.  EGD February 06 2004: (Dr. Sharlett Iles) Indication: Chest pain, reflux symptoms Findings: Normal  esophagus, stomach.  Flattened nodular folds in duodenum, biopsied Duodenal biopsies showed prominent blunting of villi with crypt hyperplasia and expansion of lamina propria with lymphocytic infiltration.  Colonoscopy February 06, 2004: (Dr. Sharlett Iles) Indication: Hematochezia, family history colon cancer Findings: Descending colon polyp, 5 mm, removed with hot snare Diminutive rectal polyp, removed, not retrieved Recommended repeat colonoscopy 3 years Pathology results not available  Colonoscopy July 27, 2008: (Dr. Sharlett Iles) Indication: History of polyps, family history colon cancer Findings: Sigmoid polyp (size not estimated), path showed tubular adenoma Recommended repeat colonoscopy in 5 years  Colonoscopy December 07, 2011: (Dr. Sharlett Iles) Indication: Positive fecal occult blood test, family history colon cancer Findings: Small cecal polyp (sessile serrated polyp), transverse colon polyp (tubular adenoma) and rectal polyp (no path) Recommended repeat colonoscopy in 5 years  Colonoscopy March 08, 2015: (Dr.Magod, [facility]) Indication: Clinically significant diarrhea of unexplained origin Findings: Normal colonoscopy, normal biopsies   Component Ref Range & Units 15 yr ago  Tissue Transglutaminase Ab, IgA <5 units 29 U/ML High        Component Ref Range & Units 15 yr ago  WBC 4.5 - 10.5 10*3/microliter 8.0  HCT 36.0 - 46.0 % 39.5  Hemoglobin 12.0 - 15.0 g/dL 13.5  Lymphocytes Relative 12.0 - 46.0 % 32.1  MCV 78.0 - 100.0 fL 83.8  MCHC 30.0 - 36.0 g/dL 34.2  Monocytes Relative 3.0 - 11.0 % 6.9  Platelets 150 - 400 K/uL 358  RBC 3.87 - 5.11  M/uL 4.71  RDW 11.5 - 14.6 % 13.3  Basophils Relative 0.0 - 1.0 % 1.3 High   Eosinophils Relative 0.0 - 5.0 % 3.4  Neutrophils Relative % 43.0 - 77.0 % 56.3  Monocytes Absolute 0.2 - 0.7 K/uL 0.6  Neutro Abs 1.4 - 7.7 K/uL 4.4  Eosinophils Absolute 0.0 - 0.6 K/uL 0.3  Basophils Absolute 0.0 - 0.1 K/uL 0.1  BUN 6 - 23 mg/dL 10   Calcium 8.4 - 10.5 mg/dL 8.7  Chloride 96 - 112 meq/L 105  Creatinine, Ser 0.4 - 1.2 mg/dL 0.7  CO2 19 - 32 meq/L 30  Glucose, Bld 70 - 99 mg/dL 123 High   Sodium 135 - 145 meq/L 140  Potassium 3.5 - 5.1 meq/L 3.7  GFR calc Af Amer mL/min 116  GFR calc non Af Amer mL/min 96  Albumin 3.5 - 5.2 g/dL 3.4 Low   Alkaline Phosphatase 39 - 117 units/L 104  ALT 0 - 40 units/L 32  AST 0 - 37 units/L 31  Total Bilirubin 0.3 - 1.2 mg/dL 0.6  Total Protein 6.0 - 8.3 g/dL 6.9  Bilirubin, Direct 0.0 - 0.3 mg/dL 0.1  Iron 42 - 145 mcg/dL 40 Low   Transferrin 212.0 - 360 mg/dL 304.0  Saturation Ratios 20.0 - 50.0 % 9.4 Low   Magnesium 1.5 - 2.5 mg/dL 2.0  Ferritin 10.0 - 291.0 ng/mL 4.8 Low      Component Ref Range & Units 6 yr ago (09/10/15) 7 yr ago (07/19/14) 8 yr ago (02/15/14) 8 yr ago (11/28/13) 9 yr ago (11/15/12) 10 yr ago (08/04/11) 11 yr ago (08/19/10) 11 yr ago (08/19/10) 11 yr ago (08/19/10) 11 yr ago (08/19/10) 11 yr ago (08/19/10)  Total Iron Binding Capacity 250 - 450 ug/dL 321 347           UIBC 131 - 425 ug/dL 266 235           Iron 27 - 159 ug/dL 55 112        110 R   Iron Saturation 15 - 55 % 17 32           Ferritin 15 - 150 ng/mL 21 43      95.4 R     Vitamin B-12 211 - 946 pg/mL 1,964 High  1,197 High  348      397 R    Folate >3.0 ng/mL <2.0 Low  5.0             Past Medical History:  Diagnosis Date   Atypical nevus 10/25/2003   left pinky toe   Atypical nevus 05/03/2018   left outer breast-moderate   BCC (basal cell carcinoma) superficial 05/03/2018   right shoulder superior   Celiac disease    Family history of malignant neoplasm of gastrointestinal tract    Hyperlipidemia    Osteopenia    Peptic ulcer    Personal history of colonic polyps 07/27/2008   TUBULAR ADENOMA   SCC (squamous cell carcinoma) in situ x 2 05/03/2018   left chest, left breast inner     Past Surgical History:  Procedure Laterality Date   ELBOW FRACTURE SURGERY Left    INNER EAR SURGERY  Left    x 2   TUBAL LIGATION     Family History  Problem Relation Age of Onset   COPD Mother    Heart disease Mother        MI   Diabetes Mother    Colon cancer Father  78   Diabetes Father    Heart disease Father    Osteoporosis Sister    Other Sister        Fatty liver   Diabetes Brother    Breast cancer Maternal Grandmother    Glaucoma Paternal Grandmother    Breast cancer Paternal Aunt    Esophageal cancer Neg Hx    Rectal cancer Neg Hx    Stomach cancer Neg Hx    Social History   Tobacco Use   Smoking status: Never   Smokeless tobacco: Never  Vaping Use   Vaping Use: Never used  Substance Use Topics   Alcohol use: No   Drug use: No   Current Outpatient Medications  Medication Sig Dispense Refill   ciclopirox (PENLAC) 8 % solution Apply 1 Application topically as needed.     Cyanocobalamin (VITAMIN B 12 PO) Take by mouth.     ezetimibe (ZETIA) 10 MG tablet Take 1 tablet (10 mg total) by mouth daily. 90 tablet 1   levothyroxine (SYNTHROID) 75 MCG tablet Take 1 tablet (75 mcg total) by mouth daily. 90 tablet 1   LORazepam (ATIVAN) 0.5 MG tablet Take 1 tablet (0.5 mg total) by mouth 2 (two) times daily as needed for anxiety. 30 tablet 1   triamterene-hydrochlorothiazide (MAXZIDE-25) 37.5-25 MG tablet Take 1 tablet by mouth daily. 90 tablet 1   Current Facility-Administered Medications  Medication Dose Route Frequency Provider Last Rate Last Admin   betamethasone acetate-betamethasone sodium phosphate (CELESTONE) injection 6 mg  6 mg Intramuscular Once Claretta Fraise, MD       Allergies  Allergen Reactions   Claritin [Loratadine] Nausea And Vomiting   Livalo [Pitavastatin]     myopathy     Review of Systems: All systems reviewed and negative except where noted in HPI.    No results found.  Physical Exam: BP 100/68 (BP Location: Left Arm, Patient Position: Sitting, Cuff Size: Normal)   Pulse 76   Ht 5' 7"  (1.702 m)   Wt 174 lb 2 oz (79 kg)   BMI 27.27  kg/m  Constitutional: Pleasant,well-developed, Caucasian female in no acute distress. HEENT: Normocephalic and atraumatic. Conjunctivae are normal. No scleral icterus. Neck supple.  Cardiovascular: Normal rate, regular rhythm.  Pulmonary/chest: Effort normal and breath sounds normal. No wheezing, rales or rhonchi. Abdominal: Soft, nondistended, nontender. Bowel sounds active throughout. There are no masses palpable. No hepatomegaly. Extremities: no edema Neurological: Alert and oriented to person place and time. Skin: Skin is warm and dry. No rashes noted. Psychiatric: Normal mood and affect. Behavior is normal.  CBC    Component Value Date/Time   WBC 9.3 02/26/2022 1459   WBC 8.9 02/20/2019 1702   RBC 4.71 02/26/2022 1459   RBC 4.66 02/20/2019 1702   HGB 15.1 02/26/2022 1459   HCT 44.4 02/26/2022 1459   PLT 323 02/26/2022 1459   MCV 94 02/26/2022 1459   MCH 32.1 02/26/2022 1459   MCH 30.0 02/20/2019 1702   MCHC 34.0 02/26/2022 1459   MCHC 32.8 02/20/2019 1702   RDW 11.8 02/26/2022 1459   LYMPHSABS 3.1 02/26/2022 1459   MONOABS 0.6 08/04/2011 1546   EOSABS 0.4 02/26/2022 1459   BASOSABS 0.1 02/26/2022 1459    CMP     Component Value Date/Time   NA 140 02/26/2022 1459   K 4.0 02/26/2022 1459   CL 101 02/26/2022 1459   CO2 25 02/26/2022 1459   GLUCOSE 100 (H) 02/26/2022 1459   GLUCOSE 85 02/20/2019 1702  BUN 14 02/26/2022 1459   CREATININE 0.77 02/26/2022 1459   CREATININE 0.61 11/15/2012 1223   CALCIUM 9.1 02/26/2022 1459   PROT 6.8 02/26/2022 1459   ALBUMIN 4.1 02/26/2022 1459   AST 27 02/26/2022 1459   ALT 28 02/26/2022 1459   ALKPHOS 100 02/26/2022 1459   BILITOT 0.2 02/26/2022 1459   GFRNONAA 96 01/24/2020 0939   GFRNONAA >89 11/15/2012 1223   GFRAA 110 01/24/2020 0939   GFRAA >89 11/15/2012 1223     ASSESSMENT AND PLAN: 61 year old female with celiac disease, history of colon polyps and family history of colon cancer, who has been off a gluten free  diet for over a year without significant GI symptoms, and whose last surveillance colonoscopy was in 2016.  Although not all of her colonoscopy reports are available for review, she did not have any high risk findings on her colonoscopies between 2005-2016.  Given her history of polyps and family history of colon cancer, I would recommend a repeat colonoscopy now, given that her last colonoscopy was in 2016.  If no polyps on this exam, would recommend a 10 year interval. Will also plan for repeat EGD with duodenal biopsies to assess for celiac activity.  Will get repeat celiac serologies and get updated Vit D level (has been over a year since checked).   [place] with patient continuing regular diet for now, given no symptoms, but if biopsies are abnormal, patient should resume gluten free diet.  History of colon polyps/fam hx CRC -- Colonoscopy  Celiac disease -- Comprehensive celiac panel -- EGD with duodenal biopsies -- Vit D level  The details, risks (including bleeding, perforation, infection, missed lesions, medication reactions and possible hospitalization or surgery if complications occur), benefits, and alternatives to EGD/colonoscopy with possible biopsy and possible polypectomy were discussed with the patient and she consents to proceed.    Avielle Imbert E. Candis Schatz, MD [facility]   CC:  Hassell Done, Mary-Margaret, *

## 2022-04-22 NOTE — Patient Instructions (Signed)
_______________________________________________________  If you are age 62 or older, your body mass index should be between 23-30. Your Body mass index is 27.27 kg/m. If this is out of the aforementioned range listed, please consider follow up with your Primary Care Provider.  If you are age 56 or younger, your body mass index should be between 19-25. Your Body mass index is 27.27 kg/m. If this is out of the aformentioned range listed, please consider follow up with your Primary Care Provider.   Your provider has requested that you go to the basement level for lab work before leaving today. Press "B" on the elevator. The lab is located at the first door on the left as you exit the elevator.  You have been scheduled for an endoscopy and colonoscopy. Please follow the written instructions given to you at your visit today. Please pick up your prep supplies at the pharmacy within the next 1-3 days. If you use inhalers (even only as needed), please bring them with you on the day of your procedure.   The [facility] providers would like to encourage you to use [facility] to communicate with providers for non-urgent requests or questions.  Due to long hold times on the telephone, sending your provider a message by [facility] may be a faster and more efficient way to get a response.  Please allow 48 business hours for a response.  Please remember that this is for non-urgent requests.   It was a pleasure to see you today!  Thank you for trusting me with your gastrointestinal care!    Scott E.Candis Schatz, MD

## 2022-04-23 ENCOUNTER — Other Ambulatory Visit: Payer: Self-pay | Admitting: Nurse Practitioner

## 2022-04-23 DIAGNOSIS — Z1231 Encounter for screening mammogram for malignant neoplasm of breast: Secondary | ICD-10-CM

## 2022-04-23 LAB — GLIADIN ANTIBODIES, SERUM
Gliadin IgA: 133.3 U/mL — ABNORMAL HIGH
Gliadin IgG: 127.7 U/mL — ABNORMAL HIGH

## 2022-04-23 LAB — TISSUE TRANSGLUTAMINASE, IGA: (tTG) Ab, IgA: 7.3 U/mL

## 2022-04-24 LAB — RETICULIN ANTIBODIES, IGA W TITER: Reticulin Ab, IgA: NEGATIVE titer (ref <2.5)

## 2022-04-27 NOTE — Progress Notes (Signed)
Maria [name], Your antigliadin antibodies were positive.  Although these antibodies are less specific for celiac disease, in your case, I believe they do indicate active celiac disease because you have an established diagnosis.  Your TTG antibody was not detected.  This is the more accurate test for celiac disease. We will await the results from your upper endoscopy/duodenal biopsies before making further recommendations.

## 2022-05-15 ENCOUNTER — Ambulatory Visit
Admission: RE | Admit: 2022-05-15 | Discharge: 2022-05-15 | Disposition: A | Discharge status: Home / Self Care | Payer: BC Managed Care – PPO | Source: Ambulatory Visit | Attending: Nurse Practitioner | Admitting: Nurse Practitioner

## 2022-05-15 DIAGNOSIS — Z1231 Encounter for screening mammogram for malignant neoplasm of breast: Secondary | ICD-10-CM | POA: Diagnosis not present

## 2022-05-19 ENCOUNTER — Encounter: Payer: Self-pay | Admitting: Gastroenterology

## 2022-05-25 DIAGNOSIS — H2513 Age-related nuclear cataract, bilateral: Secondary | ICD-10-CM | POA: Diagnosis not present

## 2022-05-25 DIAGNOSIS — H16423 Pannus (corneal), bilateral: Secondary | ICD-10-CM | POA: Diagnosis not present

## 2022-05-25 DIAGNOSIS — H43811 Vitreous degeneration, right eye: Secondary | ICD-10-CM | POA: Diagnosis not present

## 2022-05-25 DIAGNOSIS — D3132 Benign neoplasm of left choroid: Secondary | ICD-10-CM | POA: Diagnosis not present

## 2022-05-25 DIAGNOSIS — H40053 Ocular hypertension, bilateral: Secondary | ICD-10-CM | POA: Diagnosis not present

## 2022-05-27 ENCOUNTER — Ambulatory Visit (AMBULATORY_SURGERY_CENTER): Payer: BC Managed Care – PPO | Admitting: Gastroenterology

## 2022-05-27 ENCOUNTER — Encounter: Payer: Self-pay | Admitting: Gastroenterology

## 2022-05-27 VITALS — BP 105/58 | HR 65 | Temp 96.9°F | Resp 13 | Ht 67.0 in | Wt 174.0 lb

## 2022-05-27 DIAGNOSIS — K900 Celiac disease: Secondary | ICD-10-CM

## 2022-05-27 DIAGNOSIS — Z09 Encounter for follow-up examination after completed treatment for conditions other than malignant neoplasm: Secondary | ICD-10-CM | POA: Diagnosis not present

## 2022-05-27 DIAGNOSIS — K635 Polyp of colon: Secondary | ICD-10-CM | POA: Diagnosis not present

## 2022-05-27 DIAGNOSIS — Z860100 Personal history of colon polyps, unspecified: Secondary | ICD-10-CM

## 2022-05-27 DIAGNOSIS — D123 Benign neoplasm of transverse colon: Secondary | ICD-10-CM

## 2022-05-27 DIAGNOSIS — Z1211 Encounter for screening for malignant neoplasm of colon: Secondary | ICD-10-CM | POA: Diagnosis not present

## 2022-05-27 DIAGNOSIS — D122 Benign neoplasm of ascending colon: Secondary | ICD-10-CM

## 2022-05-27 MED ORDER — SODIUM CHLORIDE 0.9 % IV SOLN: 500.0000 mL | INTRAVENOUS | Status: DC

## 2022-05-27 NOTE — Op Note (Signed)
[facility] Patient Name: Maria [name] Procedure Date: 05/27/2022 2:25 PM MRN: [id number] Endoscopist: Lorin Picket E. Tomasa Rand , MD, [id number] Age: 61 Referring MD:  Date of Birth: [birth year] Gender: Female Account #: [id number] Procedure:                Upper GI endoscopy Indications:              Celiac disease Medicines:                Monitored Anesthesia Care Procedure:                Pre-Anesthesia Assessment:                           - Prior to the procedure, a History and Physical                            was performed, and patient medications and                            allergies were reviewed. The patient's tolerance of                            previous anesthesia was also reviewed. The risks                            and benefits of the procedure and the sedation                            options and risks were discussed with the patient.                            All questions were answered, and informed consent                            was obtained. Prior Anticoagulants: The patient has                            taken no anticoagulant or antiplatelet agents. ASA                            Grade Assessment: II - A patient with mild systemic                            disease. After reviewing the risks and benefits,                            the patient was deemed in satisfactory condition to                            undergo the procedure.                           After obtaining informed consent, the endoscope was  passed under direct vision. Throughout the                            procedure, the patient's blood pressure, pulse, and                            oxygen saturations were monitored continuously. The                            Endoscope was introduced through the mouth, and                            advanced to the third part of duodenum. The upper                            GI endoscopy was accomplished  without difficulty.                            The patient tolerated the procedure well. Scope In: Scope Out: Findings:                 The examined esophagus was normal.                           The entire examined stomach was normal.                           Diffuse mucosal flattening was found in the                            duodenal bulb and in the second portion of the                            duodenum. Biopsies for histology were taken with a                            cold forceps for evaluation of celiac disease.                            Estimated blood loss was minimal. Complications:            No immediate complications. Estimated Blood Loss:     Estimated blood loss was minimal. Impression:               - Normal esophagus.                           - Normal stomach.                           - Flattened mucosa was found in the duodenum,                            consistent with celiac disease. Biopsied. Recommendation:           - Patient has a contact number available for  emergencies. The signs and symptoms of potential                            delayed complications were discussed with the                            patient. Return to normal activities tomorrow.                            Written discharge instructions were provided to the                            patient.                           - Gluten free diet indefinitely.                           - Continue present medications.                           - Await pathology results. [place] Devin E. Tomasa Rand, MD 05/27/2022 3:12:39 PM This report has been signed electronically.

## 2022-05-27 NOTE — Progress Notes (Unsigned)
History and Physical Interval Note:  05/27/2022 2:20 PM  Maria [name]  has presented today for endoscopic procedure(s), with the diagnosis of  Encounter Diagnoses  Name Primary?   History of colonic polyps Yes   Celiac disease   .  The various methods of evaluation and treatment have been discussed with the patient and/or family. After consideration of risks, benefits and other options for treatment, the patient has consented to  the endoscopic procedure(s).   The patient's history has been reviewed, patient examined, no change in status, stable for endoscopic procedure(s).  I have reviewed the patient's chart and labs.  Questions were answered to the patient's satisfaction.     Ruta Capece E. Candis Schatz, MD [facility]

## 2022-05-27 NOTE — Op Note (Signed)
[facility] Patient Name: Maria [name] Procedure Date: 05/27/2022 2:24 PM MRN: [id number] Endoscopist: Lorin Picket E. Tomasa Rand , MD, [id number] Age: 61 Referring MD:  Date of Birth: 04-23-1961 Gender: Female Account #: [id number] Procedure:                Colonoscopy Indications:              Surveillance: Personal history of adenomatous                            polyps on last colonoscopy > 5 years ago Medicines:                Monitored Anesthesia Care Procedure:                Pre-Anesthesia Assessment:                           - Prior to the procedure, a History and Physical                            was performed, and patient medications and                            allergies were reviewed. The patient's tolerance of                            previous anesthesia was also reviewed. The risks                            and benefits of the procedure and the sedation                            options and risks were discussed with the patient.                            All questions were answered, and informed consent                            was obtained. Prior Anticoagulants: The patient has                            taken no anticoagulant or antiplatelet agents. ASA                            Grade Assessment: II - A patient with mild systemic                            disease. After reviewing the risks and benefits,                            the patient was deemed in satisfactory condition to                            undergo the procedure.  After obtaining informed consent, the colonoscope                            was passed under direct vision. Throughout the                            procedure, the patient's blood pressure, pulse, and                            oxygen saturations were monitored continuously. The                            Olympus CF-HQ190L ([id number]) Colonoscope was                            introduced through the  anus and advanced to the the                            terminal ileum, with identification of the                            appendiceal orifice and IC valve. The colonoscopy                            was performed without difficulty. The patient                            tolerated the procedure well. The quality of the                            bowel preparation was adequate. The terminal ileum,                            ileocecal valve, appendiceal orifice, and rectum                            were photographed. The bowel preparation used was                            SUTAB via split dose instruction. Scope In: 2:42:16 PM Scope Out: 3:03:39 PM Scope Withdrawal Time: 0 hours 17 minutes 22 seconds  Total Procedure Duration: 0 hours 21 minutes 23 seconds  Findings:                 The perianal and digital rectal examinations were                            normal. Pertinent negatives include normal                            sphincter tone and no palpable rectal lesions.                           A 6 mm polyp was found in the ascending colon. The  polyp was flat. The polyp was removed with a cold                            snare. Resection and retrieval were complete.                            Estimated blood loss was minimal.                           A 4 mm polyp was found in the transverse colon. The                            polyp was flat. The polyp was removed with a cold                            snare. Resection and retrieval were complete.                            Estimated blood loss was minimal.                           The exam was otherwise normal throughout the                            examined colon.                           The terminal ileum appeared normal. Complications:            No immediate complications. Estimated Blood Loss:     Estimated blood loss was minimal. Impression:               - One 6 mm polyp in the ascending  colon, removed                            with a cold snare. Resected and retrieved.                           - One 4 mm polyp in the transverse colon, removed                            with a cold snare. Resected and retrieved.                           - The examined portion of the ileum was normal. Recommendation:           - Patient has a contact number available for                            emergencies. The signs and symptoms of potential                            delayed complications were discussed with the  patient. Return to normal activities tomorrow.                            Written discharge instructions were provided to the                            patient.                           - Resume previous diet.                           - Continue present medications.                           - Await pathology results.                           - Repeat colonoscopy (date not yet determined) for                            surveillance based on pathology results. Maria [name] E. Tomasa Rand, MD 05/27/2022 3:17:28 PM This report has been signed electronically.

## 2022-05-27 NOTE — Patient Instructions (Signed)
YOU HAD AN ENDOSCOPIC PROCEDURE TODAY AT [facility]:   Refer to the procedure report that was given to you for any specific questions about what was found during the examination.  If the procedure report does not answer your questions, please call your gastroenterologist to clarify.  If you requested that your care partner not be given the details of your procedure findings, then the procedure report has been included in a sealed envelope for you to review at your convenience later.  YOU SHOULD EXPECT: Some feelings of bloating in the abdomen. Passage of more gas than usual.  Walking can help get rid of the air that was put into your GI tract during the procedure and reduce the bloating. If you had a lower endoscopy (such as a colonoscopy or flexible sigmoidoscopy) you may notice spotting of blood in your stool or on the toilet paper. If you underwent a bowel prep for your procedure, you may not have a normal bowel movement for a few days.  Please Note:  You might notice some irritation and congestion in your nose or some drainage.  This is from the oxygen used during your procedure.  There is no need for concern and it should clear up in a day or so.  SYMPTOMS TO REPORT IMMEDIATELY:  Following lower endoscopy (colonoscopy or flexible sigmoidoscopy):  Excessive amounts of blood in the stool  Significant tenderness or worsening of abdominal pains  Swelling of the abdomen that is new, acute  Fever of 100F or higher  Following upper endoscopy (EGD)  Vomiting of blood or coffee ground material  New chest pain or pain under the shoulder blades  Painful or persistently difficult swallowing  New shortness of breath  Fever of 100F or higher  Black, tarry-looking stools  For urgent or emergent issues, a gastroenterologist can be reached at any hour by calling [phone]. Do not use MyChart messaging for urgent concerns.    DIET:  We do recommend a small meal at first, but  then you may proceed to your regular diet.  Drink plenty of fluids but you should avoid alcoholic beverages for 24 hours.  ACTIVITY:  You should plan to take it easy for the rest of today and you should NOT DRIVE or use heavy machinery until tomorrow (because of the sedation medicines used during the test).    FOLLOW UP: Our staff will call the number listed on your records the next business day following your procedure.  We will call around 7:15- 8:00 am to check on you and address any questions or concerns that you may have regarding the information given to you following your procedure. If we do not reach you, we will leave a message.     If any biopsies were taken you will be contacted by phone or by letter within the next 1-3 weeks.  Please call us at [phone] if you have not heard about the biopsies in 3 weeks.    SIGNATURES/CONFIDENTIALITY: You and/or your care partner have signed paperwork which will be entered into your electronic medical record.  These signatures attest to the fact that that the information above on your After Visit Summary has been reviewed and is understood.  Full responsibility of the confidentiality of this discharge information lies with you and/or your care-partner.

## 2022-05-27 NOTE — Progress Notes (Unsigned)
Sedate, gd SR, tolerated procedure well, VSS, report to RN 

## 2022-05-27 NOTE — Progress Notes (Unsigned)
Pt's states no medical or surgical changes since previsit or office visit. 

## 2022-05-28 ENCOUNTER — Telehealth: Payer: Self-pay | Admitting: *Deleted

## 2022-05-28 NOTE — Telephone Encounter (Signed)
  Follow up Call-     05/27/2022    1:33 PM  Call back number  Post procedure Call Back phone  # [phone]  Permission to leave phone message Yes     Patient questions:  Do you have a fever, pain , or abdominal swelling? No. Pain Score  0 *  Have you tolerated food without any problems? Yes.    Have you been able to return to your normal activities? Yes.    Do you have any questions about your discharge instructions: Diet   No. Medications  No. Follow up visit  No.  Do you have questions or concerns about your Care? No.  Actions: * If pain score is 4 or above: No action needed, pain <4.

## 2022-06-03 NOTE — Progress Notes (Signed)
Ms. Incorvaia,  The biopsies of your duodenum showed findings consistent with active celiac disease.  Ongoing consumption of gluten in your diet will continue to cause inflammation and damage to your intestines.  This increases the risk for malabsorption of nutrients, malnutrition and as discussed, may increase the risk for intestinal lymphoma.  Please make an effort to be more strict with your adherence to a gluten-free diet. Please follow-up with me in 1 year.  The two polyps which I removed during your recent procedure were proven to be completely benign but are considered "pre-cancerous" polyps that MAY have grown into cancer if they had not been removed.  Studies shows that at least 20% of women over age 6 and 30% of men over age 21 have pre-cancerous polyps.  Based on current nationally recognized surveillance guidelines, I recommend that you have a repeat colonoscopy in 7 years.

## 2022-06-27 ENCOUNTER — Other Ambulatory Visit: Payer: Self-pay | Admitting: Nurse Practitioner

## 2022-06-27 DIAGNOSIS — F419 Anxiety disorder, unspecified: Secondary | ICD-10-CM

## 2022-08-27 ENCOUNTER — Ambulatory Visit (INDEPENDENT_AMBULATORY_CARE_PROVIDER_SITE_OTHER): Payer: 59

## 2022-08-27 ENCOUNTER — Encounter: Payer: Self-pay | Admitting: Nurse Practitioner

## 2022-08-27 ENCOUNTER — Ambulatory Visit: Payer: 59 | Admitting: Nurse Practitioner

## 2022-08-27 VITALS — BP 99/65 | HR 69 | Temp 98.6°F | Resp 20 | Ht 67.0 in | Wt 168.0 lb

## 2022-08-27 DIAGNOSIS — I10 Essential (primary) hypertension: Secondary | ICD-10-CM

## 2022-08-27 DIAGNOSIS — Z6827 Body mass index (BMI) 27.0-27.9, adult: Secondary | ICD-10-CM

## 2022-08-27 DIAGNOSIS — E785 Hyperlipidemia, unspecified: Secondary | ICD-10-CM | POA: Diagnosis not present

## 2022-08-27 DIAGNOSIS — R739 Hyperglycemia, unspecified: Secondary | ICD-10-CM

## 2022-08-27 DIAGNOSIS — E039 Hypothyroidism, unspecified: Secondary | ICD-10-CM

## 2022-08-27 DIAGNOSIS — F419 Anxiety disorder, unspecified: Secondary | ICD-10-CM

## 2022-08-27 DIAGNOSIS — K900 Celiac disease: Secondary | ICD-10-CM

## 2022-08-27 DIAGNOSIS — M810 Age-related osteoporosis without current pathological fracture: Secondary | ICD-10-CM

## 2022-08-27 DIAGNOSIS — K219 Gastro-esophageal reflux disease without esophagitis: Secondary | ICD-10-CM | POA: Diagnosis not present

## 2022-08-27 MED ORDER — LEVOTHYROXINE SODIUM 75 MCG PO TABS: 75.0000 ug | ORAL_TABLET | Freq: Every day | ORAL | 1 refills | Status: DC

## 2022-08-27 MED ORDER — TRIAMTERENE-HCTZ 37.5-25 MG PO TABS: 1.0000 | ORAL_TABLET | Freq: Every day | ORAL | 1 refills | Status: DC

## 2022-08-27 MED ORDER — LORAZEPAM 0.5 MG PO TABS: 0.5000 mg | ORAL_TABLET | Freq: Two times a day (BID) | ORAL | 1 refills | Status: DC | PRN

## 2022-08-27 MED ORDER — EZETIMIBE 10 MG PO TABS: 10.0000 mg | ORAL_TABLET | Freq: Every day | ORAL | 1 refills | Status: DC

## 2022-08-27 NOTE — Progress Notes (Signed)
Subjective:    Patient ID: Maria [name], female    DOB: [birth year], 62 y.o.   MRN: [id number]   Chief Complaint: medical management of chronic issues     HPI:  Maria [name] is a 62 y.o. who identifies as a female who was assigned female at birth.   Social history: Lives with: husband  Work history: retired   Scientist, forensic in today for follow up of the following chronic medical issues:  1. Essential hypertension No c/o chest pain, sob or headache. Does not check blood pressure at home. BP Readings from Last 3 Encounters:  05/27/22 (!) 105/58  04/22/22 100/68  02/26/22 105/63     2. Hyperlipidemia with target LDL less than 100 Does try to watch diet and stays very active  3. CELIAC DISEASE She watches her diet very closely because if she eats something she is not suppose  to she gets very sick.  4. Gastroesophageal reflux disease without esophagitis On no prescription meds  5. Osteoporosis without current pathological fracture, unspecified osteoporosis type Last dexascan was done on 08/20/20.  Your t score is -2.4. she does no weight bearing exercises.  6. GAD    08/27/2022    9:55 AM 02/26/2022    2:13 PM 12/23/2021    3:26 PM 09/25/2021   10:30 AM  GAD 7 : Generalized Anxiety Score  Nervous, Anxious, on Edge 0 0 0 0  Control/stop worrying 0 0 0 0  Worry too much - different things 1 0 0 0  Trouble relaxing 0 0 0 0  Restless 0 0 0 0  Easily annoyed or irritable 0 0 0 0  Afraid - awful might happen 0 0 0 0  Total GAD 7 Score 1 0 0 0  Anxiety Difficulty Not difficult at all Not difficult at all Not difficult at all Not difficult at all       7. BMI 27.0-27.9,adult Weight is down 6lbs Wt Readings from Last 3 Encounters:  08/27/22 168 lb (76.2 kg)  05/27/22 174 lb (78.9 kg)  04/22/22 174 lb 2 oz (79 kg)   BMI Readings from Last 3 Encounters:  08/27/22 26.31 kg/m  05/27/22 27.25 kg/m  04/22/22 27.27 kg/m     New complaints: None today  Allergies  Allergen  Reactions   Claritin [Loratadine] Nausea And Vomiting   Livalo [Pitavastatin]     myopathy   Outpatient Encounter Medications as of 08/27/2022  Medication Sig   Cyanocobalamin (VITAMIN B 12 PO) Take by mouth.   ezetimibe (ZETIA) 10 MG tablet Take 1 tablet (10 mg total) by mouth daily.   levothyroxine (SYNTHROID) 75 MCG tablet Take 1 tablet (75 mcg total) by mouth daily.   LORazepam (ATIVAN) 0.5 MG tablet Take 1 tablet (0.5 mg total) by mouth 2 (two) times daily as needed for anxiety.   triamterene-hydrochlorothiazide (MAXZIDE-25) 37.5-25 MG tablet Take 1 tablet by mouth daily.   Facility-Administered Encounter Medications as of 08/27/2022  Medication   betamethasone acetate-betamethasone sodium phosphate (CELESTONE) injection 6 mg    Past Surgical History:  Procedure Laterality Date   ELBOW FRACTURE SURGERY Left    INNER EAR SURGERY Left    x 2   TUBAL LIGATION      Family History  Problem Relation Age of Onset   COPD Mother    Heart disease Mother        MI   Diabetes Mother    Colon cancer Father 60   Diabetes Father    Heart  disease Father    Osteoporosis Sister    Other Sister        Fatty liver   Diabetes Brother    Breast cancer Maternal Grandmother    Glaucoma Paternal Grandmother    Breast cancer Paternal Aunt    Esophageal cancer Neg Hx    Rectal cancer Neg Hx    Stomach cancer Neg Hx       Controlled substance contract: n/a     Review of Systems  Constitutional:  Negative for diaphoresis.  Eyes:  Negative for pain.  Respiratory:  Negative for shortness of breath.   Cardiovascular:  Negative for chest pain, palpitations and leg swelling.  Gastrointestinal:  Negative for abdominal pain.  Endocrine: Negative for polydipsia.  Skin:  Negative for rash.  Neurological:  Negative for dizziness, weakness and headaches.  Hematological:  Does not bruise/bleed easily.  All other systems reviewed and are negative.      Objective:   Physical Exam Vitals  and nursing note reviewed.  Constitutional:      General: She is not in acute distress.    Appearance: Normal appearance. She is well-developed.  HENT:     Head: Normocephalic.     Right Ear: Tympanic membrane normal.     Left Ear: Tympanic membrane normal.     Nose: Nose normal.     Mouth/Throat:     Mouth: Mucous membranes are moist.  Eyes:     Pupils: Pupils are equal, round, and reactive to light.  Neck:     Vascular: No carotid bruit or JVD.  Cardiovascular:     Rate and Rhythm: Normal rate and regular rhythm.     Heart sounds: Normal heart sounds.  Pulmonary:     Effort: Pulmonary effort is normal. No respiratory distress.     Breath sounds: Normal breath sounds. No wheezing or rales.  Chest:     Chest wall: No tenderness.  Abdominal:     General: Bowel sounds are normal. There is no distension or abdominal bruit.     Palpations: Abdomen is soft. There is no hepatomegaly, splenomegaly, mass or pulsatile mass.     Tenderness: There is no abdominal tenderness.  Musculoskeletal:        General: Normal range of motion.     Cervical back: Normal range of motion and neck supple.     Right lower leg: Edema (1+) present.     Left lower leg: Edema (1+) present.  Lymphadenopathy:     Cervical: No cervical adenopathy.  Skin:    General: Skin is warm and dry.  Neurological:     Mental Status: She is alert and oriented to person, place, and time.     Deep Tendon Reflexes: Reflexes are normal and symmetric.  Psychiatric:        Behavior: Behavior normal.        Thought Content: Thought content normal.        Judgment: Judgment normal.     BP 99/65   Pulse 69   Temp 98.6 F (37 C) (Temporal)   Resp 20   Ht '5\' 7"'$  (1.702 m)   Wt 168 lb (76.2 kg)   SpO2 99%   BMI 26.31 kg/m        Assessment & Plan:  Maria [name] comes in today with chief complaint of Medical Management of Chronic Issues   Diagnosis and orders addressed:  1. Essential hypertension Low sodium  diet - triamterene-hydrochlorothiazide (MAXZIDE-25) 37.5-25 MG tablet; Take 1 tablet  by mouth daily.  Dispense: 90 tablet; Refill: 1  2. Hyperlipidemia with target LDL less than 100 Low fat diet - ezetimibe (ZETIA) 10 MG tablet; Take 1 tablet (10 mg total) by mouth daily.  Dispense: 90 tablet; Refill: 1  3. CELIAC DISEASE Continue to watch diet  4. Gastroesophageal reflux disease without esophagitis Avoid spicy foods Do not eat 2 hours prior to bedtime  5. Osteoporosis without current pathological fracture, unspecified osteoporosis type Weight bearing exercises  6. BMI 27.0-27.9,adult Discussed diet and exercise for person with BMI >25 Will recheck weight in 3-6 months   7. Acquired hypothyroidism Labs pending - levothyroxine (SYNTHROID) 75 MCG tablet; Take 1 tablet (75 mcg total) by mouth daily.  Dispense: 90 tablet; Refill: 1  8. Anxiety Stress ,management - ToxASSURE Select 13 (MW), Urine - LORazepam (ATIVAN) 0.5 MG tablet; Take 1 tablet (0.5 mg total) by mouth 2 (two) times daily as needed for anxiety.  Dispense: 30 tablet; Refill: 1   Labs pending Health Maintenance reviewed Diet and exercise encouraged  Follow up plan: 6 months   Mary-Margaret Hassell Done, FNP

## 2022-08-27 NOTE — Patient Instructions (Signed)
Bone Health Bones protect organs, store calcium, anchor muscles, and support the whole body. Keeping your bones strong is important, especially as you get older. You can take actions to help keep your bones strong and healthy. Why is keeping my bones healthy important?  Keeping your bones healthy is important because your body constantly replaces bone cells. Cells get old, and new cells take their place. As we age, we lose bone cells because the body may not be able to make enough new cells to replace the old cells. The amount of bone cells and bone tissue you have is referred to as bone mass. The higher your bone mass, the stronger your bones. The aging process leads to an overall loss of bone mass in the body, which can increase the likelihood of: Broken bones. A condition in which the bones become weak and brittle (osteoporosis). A large decline in bone mass occurs in older adults. In women, it occurs about the time of menopause. What actions can I take to keep my bones healthy? Good health habits are important for maintaining healthy bones. This includes eating nutritious foods and exercising regularly. To have healthy bones, you need to get enough of the right minerals and vitamins. Most nutrition experts recommend getting these nutrients from the foods that you eat. In some cases, taking supplements may also be recommended. Doing certain types of exercise is also important for bone health. What are the nutritional recommendations for healthy bones?  Eating a well-balanced diet with plenty of calcium and vitamin D will help to protect your bones. Nutritional recommendations vary from person to person. Ask your health care provider what is healthy for you. Here are some general guidelines. Get enough calcium Calcium is the most important (essential) mineral for bone health. Most people can get enough calcium from their diet, but supplements may be recommended for people who are at risk for  osteoporosis. Good sources of calcium include: Dairy products, such as low-fat or nonfat milk, cheese, and yogurt. Dark green leafy vegetables, such as bok choy and broccoli. Foods that have calcium added to them (are fortified). Foods that may be fortified with calcium include orange juice, cereal, bread, soy beverages, and tofu products. Nuts, such as almonds. Follow these recommended amounts for daily calcium intake: Infants, 0-6 months: 200 mg. Infants, 6-12 months: 260 mg. Children, age 1-3: 700 mg. Children, age 4-8: 1,000 mg. Children, age 9-13: 1,300 mg. Teens, age 14-18: 1,300 mg. Adults, age 19-50: 1,000 mg. Adults, age 51-70: Men: 1,000 mg. Women: 1,200 mg. Adults, age 71 or older: 1,200 mg. Pregnant and breastfeeding females: Teens: 1,300 mg. Adults: 1,000 mg. Get enough vitamin D Vitamin D is the most essential vitamin for bone health. It helps the body absorb calcium. Sunlight stimulates the skin to make vitamin D, so be sure to get enough sunlight. If you live in a cold climate or you do not get outside often, your health care provider may recommend that you take vitamin D supplements. Good sources of vitamin D in your diet include: Egg yolks. Saltwater fish. Milk and cereal fortified with vitamin D. Follow these recommended amounts for daily vitamin D intake: Infants, 0-12 months: 400 international units (IU). Children and teens, age 1-18: 600 international units. Adults, age 59 or younger: 600 international units. Adults, age 60 or older: 600-1,000 international units. Get other important nutrients Other nutrients that are important for bone health include: Phosphorus. This mineral is found in meat, poultry, dairy foods, nuts, and legumes. The   recommended daily intake for adult men and adult women is 700 mg. Magnesium. This mineral is found in seeds, nuts, dark green vegetables, and legumes. The recommended daily intake for adult men is 400-420 mg. For adult women,  it is 310-320 mg. Vitamin K. This vitamin is found in green leafy vegetables. The recommended daily intake is 120 mcg for adult men and 90 mcg for adult women. What type of physical activity is best for building and maintaining healthy bones? Weight-bearing and strength-building activities are important for building and maintaining healthy bones. Weight-bearing activities cause muscles and bones to work against gravity. Strength-building activities increase the strength of the muscles that support bones. Weight-bearing and muscle-building activities include: Walking and hiking. Jogging and running. Dancing. Gym exercises. Lifting weights. Tennis and racquetball. Climbing stairs. Aerobics. Adults should get at least 30 minutes of moderate physical activity on most days. Children should get at least 60 minutes of moderate physical activity on most days. Ask your health care provider what type of exercise is best for you. How can I find out if my bone mass is low? Bone mass can be measured with an X-ray test called a bone mineral density (BMD) test. This test is recommended for all women who are age 65 or older. It may also be recommended for: Men who are age 70 or older. People who are at risk for osteoporosis because of: Having a long-term disease that weakens bones, such as kidney disease or rheumatoid arthritis. Having menopause earlier than normal. Taking medicine that weakens bones, such as steroids, thyroid hormones, or hormone treatment for breast cancer or prostate cancer. Smoking. Drinking three or more alcoholic drinks a day. Being underweight. Sedentary lifestyle. If you find that you have a low bone mass, you may be able to prevent osteoporosis or further bone loss by changing your diet and lifestyle. Where can I find more information? Bone Health & Osteoporosis Foundation: www.nof.org/patients National Institutes of Health: www.bones.nih.gov International Osteoporosis  Foundation: www.iofbonehealth.org Summary The aging process leads to an overall loss of bone mass in the body, which can increase the likelihood of broken bones and osteoporosis. Eating a well-balanced diet with plenty of calcium and vitamin D will help to protect your bones. Weight-bearing and strength-building activities are also important for building and maintaining strong bones. Bone mass can be measured with an X-ray test called a bone mineral density (BMD) test. This information is not intended to replace advice given to you by your health care provider. Make sure you discuss any questions you have with your health care provider. Document Revised: 11/13/2020 Document Reviewed: 11/13/2020 Elsevier Patient Education  2023 [employer]vier Inc.  

## 2022-08-28 DIAGNOSIS — M810 Age-related osteoporosis without current pathological fracture: Secondary | ICD-10-CM | POA: Diagnosis not present

## 2022-08-28 LAB — CBC WITH DIFFERENTIAL/PLATELET
Basophils Absolute: 0.1 10*3/uL (ref 0.0–0.2)
Basos: 1 %
EOS (ABSOLUTE): 0.2 10*3/uL (ref 0.0–0.4)
Eos: 3 %
Hematocrit: 43.1 % (ref 34.0–46.6)
Hemoglobin: 14.8 g/dL (ref 11.1–15.9)
Immature Grans (Abs): 0 10*3/uL (ref 0.0–0.1)
Immature Granulocytes: 0 %
Lymphocytes Absolute: 2 10*3/uL (ref 0.7–3.1)
Lymphs: 31 %
MCH: 32.1 pg (ref 26.6–33.0)
MCHC: 34.3 g/dL (ref 31.5–35.7)
MCV: 94 fL (ref 79–97)
Monocytes Absolute: 0.6 10*3/uL (ref 0.1–0.9)
Monocytes: 9 %
Neutrophils Absolute: 3.5 10*3/uL (ref 1.4–7.0)
Neutrophils: 56 %
Platelets: 297 10*3/uL (ref 150–450)
RBC: 4.61 x10E6/uL (ref 3.77–5.28)
RDW: 12.3 % (ref 11.7–15.4)
WBC: 6.3 10*3/uL (ref 3.4–10.8)

## 2022-08-28 LAB — CMP14+EGFR
ALT: 23 IU/L (ref 0–32)
AST: 22 IU/L (ref 0–40)
Albumin/Globulin Ratio: 1.8 (ref 1.2–2.2)
Albumin: 4.2 g/dL (ref 3.9–4.9)
Alkaline Phosphatase: 84 IU/L (ref 44–121)
BUN/Creatinine Ratio: 20 (ref 12–28)
BUN: 16 mg/dL (ref 8–27)
Bilirubin Total: 0.4 mg/dL (ref 0.0–1.2)
CO2: 25 mmol/L (ref 20–29)
Calcium: 8.9 mg/dL (ref 8.7–10.3)
Chloride: 103 mmol/L (ref 96–106)
Creatinine, Ser: 0.81 mg/dL (ref 0.57–1.00)
Globulin, Total: 2.4 g/dL (ref 1.5–4.5)
Glucose: 113 mg/dL — ABNORMAL HIGH (ref 70–99)
Potassium: 3.9 mmol/L (ref 3.5–5.2)
Sodium: 145 mmol/L — ABNORMAL HIGH (ref 134–144)
Total Protein: 6.6 g/dL (ref 6.0–8.5)
eGFR: 82 mL/min/{1.73_m2} (ref >59)

## 2022-08-28 LAB — THYROID PANEL WITH TSH
Free Thyroxine Index: 2.1 (ref 1.2–4.9)
T3 Uptake Ratio: 24 % (ref 24–39)
T4, Total: 8.7 ug/dL (ref 4.5–12.0)
TSH: 1.26 u[IU]/mL (ref 0.450–4.500)

## 2022-08-28 LAB — LIPID PANEL
Chol/HDL Ratio: 3.6 ratio (ref 0.0–4.4)
Cholesterol, Total: 162 mg/dL (ref 100–199)
HDL: 45 mg/dL (ref >39)
LDL Chol Calc (NIH): 95 mg/dL (ref 0–99)
Triglycerides: 121 mg/dL (ref 0–149)
VLDL Cholesterol Cal: 22 mg/dL (ref 5–40)

## 2022-08-28 MED ORDER — ALENDRONATE SODIUM 70 MG PO TABS: 70.0000 mg | ORAL_TABLET | ORAL | 11 refills | Status: DC

## 2022-08-28 NOTE — Addendum Note (Signed)
Addended by: Chevis Pretty on: 08/28/2022 11:28 AM   Modules accepted: Orders

## 2022-08-28 NOTE — Addendum Note (Signed)
Addended by: Chevis Pretty on: 08/28/2022 11:31 AM   Modules accepted: Orders

## 2022-08-31 LAB — HGB A1C W/O EAG: Hgb A1c MFr Bld: 5.7 % — ABNORMAL HIGH (ref 4.8–5.6)

## 2022-08-31 LAB — SPECIMEN STATUS REPORT

## 2022-09-02 LAB — TOXASSURE SELECT 13 (MW), URINE

## 2022-10-07 ENCOUNTER — Telehealth: Payer: 59 | Admitting: Family Medicine

## 2022-10-15 ENCOUNTER — Ambulatory Visit: Payer: 59 | Admitting: Nurse Practitioner

## 2022-10-20 DIAGNOSIS — E039 Hypothyroidism, unspecified: Secondary | ICD-10-CM | POA: Diagnosis not present

## 2022-10-20 DIAGNOSIS — Z7983 Long term (current) use of bisphosphonates: Secondary | ICD-10-CM | POA: Diagnosis not present

## 2022-10-20 DIAGNOSIS — E785 Hyperlipidemia, unspecified: Secondary | ICD-10-CM | POA: Diagnosis not present

## 2022-10-20 DIAGNOSIS — I129 Hypertensive chronic kidney disease with stage 1 through stage 4 chronic kidney disease, or unspecified chronic kidney disease: Secondary | ICD-10-CM | POA: Diagnosis not present

## 2022-10-20 DIAGNOSIS — Z8249 Family history of ischemic heart disease and other diseases of the circulatory system: Secondary | ICD-10-CM | POA: Diagnosis not present

## 2022-10-20 DIAGNOSIS — K219 Gastro-esophageal reflux disease without esophagitis: Secondary | ICD-10-CM | POA: Diagnosis not present

## 2022-10-20 DIAGNOSIS — Z888 Allergy status to other drugs, medicaments and biological substances status: Secondary | ICD-10-CM | POA: Diagnosis not present

## 2022-10-20 DIAGNOSIS — N182 Chronic kidney disease, stage 2 (mild): Secondary | ICD-10-CM | POA: Diagnosis not present

## 2022-10-20 DIAGNOSIS — Z87892 Personal history of anaphylaxis: Secondary | ICD-10-CM | POA: Diagnosis not present

## 2022-10-20 DIAGNOSIS — K900 Celiac disease: Secondary | ICD-10-CM | POA: Diagnosis not present

## 2022-10-20 DIAGNOSIS — Z809 Family history of malignant neoplasm, unspecified: Secondary | ICD-10-CM | POA: Diagnosis not present

## 2022-10-20 DIAGNOSIS — F419 Anxiety disorder, unspecified: Secondary | ICD-10-CM | POA: Diagnosis not present

## 2022-11-17 ENCOUNTER — Telehealth: Payer: 59

## 2022-11-17 ENCOUNTER — Encounter: Payer: Self-pay | Admitting: Family

## 2022-11-17 ENCOUNTER — Telehealth (INDEPENDENT_AMBULATORY_CARE_PROVIDER_SITE_OTHER): Payer: 59 | Admitting: Family

## 2022-11-17 DIAGNOSIS — M10271 Drug-induced gout, right ankle and foot: Secondary | ICD-10-CM

## 2022-11-17 MED ORDER — COLCHICINE 0.6 MG PO TABS: ORAL_TABLET | ORAL | 0 refills | Status: DC

## 2022-11-17 NOTE — Patient Instructions (Signed)
Gout  Gout is a condition that causes painful swelling of the joints. Gout is a type of inflammation of the joints (arthritis). This condition is caused by having too much uric acid in the body. Uric acid is a chemical that forms when the body breaks down substances called purines. Purines are important for building body proteins. When the body has too much uric acid, sharp crystals can form and build up inside the joints. This causes pain and swelling. Gout attacks can happen quickly and may be very painful (acute gout). Over time, the attacks can affect more joints and become more frequent (chronic gout). Gout can also cause uric acid to build up under the skin and inside the kidneys. What are the causes? This condition is caused by too much uric acid in your blood. This can happen because: Your kidneys do not remove enough uric acid from your blood. This is the most common cause. Your body makes too much uric acid. This can happen with some cancers and cancer treatments. It can also occur if your body is breaking down too many red blood cells (hemolytic anemia). You eat too many foods that are high in purines. These foods include organ meats and some seafood. Alcohol, especially beer, is also high in purines. A gout attack may be triggered by trauma or stress. What increases the risk? The following factors may make you more likely to develop this condition: Having a family history of gout. Being female and middle-aged. Being female and having gone through menopause. Taking certain medicines, including aspirin, cyclosporine, diuretics, levodopa, and niacin. Having an organ transplant. Having certain conditions, such as: Being obese. Lead poisoning. Kidney disease. A skin condition called psoriasis. Other factors include: Losing weight too quickly. Being dehydrated. Frequently drinking alcohol, especially beer. Frequently drinking beverages that are sweetened with a type of sugar called  fructose. What are the signs or symptoms? An attack of acute gout happens quickly. It usually occurs in just one joint. The most common place is the big toe. Attacks often start at night. Other joints that may be affected include joints of the feet, ankle, knee, fingers, wrist, or elbow. Symptoms of this condition may include: Severe pain. Warmth. Swelling. Stiffness. Tenderness. The affected joint may be very painful to touch. Shiny, red, or purple skin. Chills and fever. Chronic gout may cause symptoms more frequently. More joints may be involved. You may also have white or yellow lumps (tophi) on your hands or feet or in other areas near your joints. How is this diagnosed? This condition is diagnosed based on your symptoms, your medical history, and a physical exam. You may have tests, such as: Blood tests to measure uric acid levels. Removal of joint fluid with a thin needle (aspiration) to look for uric acid crystals. X-rays to look for joint damage. How is this treated? Treatment for this condition has two phases: treating an acute attack and preventing future attacks. Acute gout treatment may include medicines to reduce pain and swelling, including: NSAIDs, such as ibuprofen. Steroids. These are strong anti-inflammatory medicines that can be taken by mouth (orally) or injected into a joint. Colchicine. This medicine relieves pain and swelling when it is taken soon after an attack. It can be given by mouth or through an IV. Preventive treatment may include: Daily use of smaller doses of NSAIDs or colchicine. Use of a medicine that reduces uric acid levels in your blood, such as allopurinol. Changes to your diet. You may need to see   a dietitian about what to eat and drink to prevent gout. Follow these instructions at home: During a gout attack  If directed, put ice on the affected area. To do this: Put ice in a plastic bag. Place a towel between your skin and the bag. Leave the  ice on for 20 minutes, 2-3 times a day. Remove the ice if your skin turns bright red. This is very important. If you cannot feel pain, heat, or cold, you have a greater risk of damage to the area. Raise (elevate) the affected joint above the level of your heart as often as possible. Rest the joint as much as possible. If the affected joint is in your leg, you may be given crutches to use. Follow instructions from your health care provider about eating or drinking restrictions. Avoiding future gout attacks Follow a low-purine diet as told by your dietitian or health care provider. Avoid foods and drinks that are high in purines, including liver, kidney, anchovies, asparagus, herring, mushrooms, mussels, and beer. Maintain a healthy weight or lose weight if you are overweight. If you want to lose weight, talk with your health care provider. Do not lose weight too quickly. Start or maintain an exercise program as told by your health care provider. Eating and drinking Avoid drinking beverages that contain fructose. Drink enough fluids to keep your urine pale yellow. If you drink alcohol: Limit how much you have to: 0-1 drink a day for women who are not pregnant. 0-2 drinks a day for men. Know how much alcohol is in a drink. In the [place] one drink equals one 12 oz bottle of beer (355 mL), one 5 oz glass of wine (148 mL), or one 1 oz glass of hard liquor (44 mL). General instructions Take over-the-counter and prescription medicines only as told by your health care provider. Ask your health care provider if the medicine prescribed to you requires you to avoid driving or using machinery. Return to your normal activities as told by your health care provider. Ask your health care provider what activities are safe for you. Keep all follow-up visits. This is important. Where to find more information National Institutes of Health: www.niams.nih.gov Contact a health care provider if you have: Another  gout attack. Continuing symptoms of a gout attack after 10 days of treatment. Side effects from your medicines. Chills or a fever. Burning pain when you urinate. Pain in your lower back or abdomen. Get help right away if you: Have severe or uncontrolled pain. Cannot urinate. Summary Gout is painful swelling of the joints caused by having too much uric acid in the body. The most common site for gout to occur is in the big toe, but it can affect other joints in the body. Medicines and dietary changes can help to prevent and treat gout attacks. This information is not intended to replace advice given to you by your health care provider. Make sure you discuss any questions you have with your health care provider. Document Revised: 03/05/2021 Document Reviewed: 03/05/2021 Elsevier Patient Education  2024 Elsevier Inc.  

## 2022-11-17 NOTE — Progress Notes (Signed)
Virtual Visit Consent   [name], you are scheduled for a virtual visit with a [facility] provider today. Just as with appointments in the office, your consent must be obtained to participate. Your consent will be active for this visit and any virtual visit you may have with one of our providers in the next 365 days. If you have a MyChart account, a copy of this consent can be sent to you electronically.  As this is a virtual visit, video technology does not allow for your provider to perform a traditional examination. This may limit your provider's ability to fully assess your condition. If your provider identifies any concerns that need to be evaluated in person or the need to arrange testing (such as labs, EKG, etc.), we will make arrangements to do so. Although advances in technology are sophisticated, we cannot ensure that it will always work on either your end or our end. If the connection with a video visit is poor, the visit may have to be switched to a telephone visit. With either a video or telephone visit, we are not always able to ensure that we have a secure connection.  By engaging in this virtual visit, you consent to the provision of healthcare and authorize for your insurance to be billed (if applicable) for the services provided during this visit. Depending on your insurance coverage, you may receive a charge related to this service.  I need to obtain your verbal consent now. Are you willing to proceed with your visit today? [name] has provided verbal consent on 11/17/2022 for a virtual visit (video or telephone). [name], FNP  Date: 11/17/2022 3:25 PM  Virtual Visit via Video Note   I, [name], connected with  Maria [name]  ([id number], 11/22/1960) on 11/17/22 at  3:10 PM EDT by a video-enabled telemedicine application and verified that I am speaking with the correct person using two identifiers.  Location: Patient: Virtual Visit Location Patient: Home Provider:  Virtual Visit Location Provider: Office/Clinic   I discussed the limitations of evaluation and management by telemedicine and the availability of in person appointments. The patient expressed understanding and agreed to proceed.    History of Present Illness: Maria [name] is a 62 y.o. who identifies as a female who was assigned female at birth, and is being seen today for gout in right great toe that started Sunday. Reports redness, swelling, and tenderness. Has hx of gout. Taken ibuprofen and tylenol without relief. Reports her pain is constant aching pain of 6-7 out 10.   HPI: HPI  Problems:  Patient Active Problem List   Diagnosis Date Noted   Osteoporosis 09/20/2015   BMI 27.0-27.9,adult 09/10/2015   Essential hypertension 07/19/2014   Hyperlipidemia with target LDL less than 100 07/19/2014   External hemorrhoids 07/23/2008   GERD 07/23/2008   CELIAC DISEASE 07/23/2008   COLONIC POLYPS, HX OF 07/23/2008    Allergies:  Allergies  Allergen Reactions   Claritin [Loratadine] Nausea And Vomiting   Livalo [Pitavastatin]     myopathy   Medications:  Current Outpatient Medications:    colchicine 0.6 MG tablet, Take 2 tabs immediately, then 1 tab twice per day for the duration of the flare up to a max of 7 days, Disp: 21 tablet, Rfl: 0   alendronate (FOSAMAX) 70 MG tablet, Take 1 tablet (70 mg total) by mouth every 7 (seven) days. Take with a full glass of water on an empty stomach., Disp: 4 tablet, Rfl: 11  Cyanocobalamin (VITAMIN B 12 PO), Take by mouth., Disp: , Rfl:    ezetimibe (ZETIA) 10 MG tablet, Take 1 tablet (10 mg total) by mouth daily., Disp: 90 tablet, Rfl: 1   levothyroxine (SYNTHROID) 75 MCG tablet, Take 1 tablet (75 mcg total) by mouth daily., Disp: 90 tablet, Rfl: 1   LORazepam (ATIVAN) 0.5 MG tablet, Take 1 tablet (0.5 mg total) by mouth 2 (two) times daily as needed for anxiety., Disp: 30 tablet, Rfl: 1   triamterene-hydrochlorothiazide (MAXZIDE-25) 37.5-25 MG tablet,  Take 1 tablet by mouth daily., Disp: 90 tablet, Rfl: 1  Observations/Objective: Patient is well-developed, well-nourished in no acute distress.  Resting comfortably  at home.  Head is normocephalic, atraumatic.  No labored breathing.  Speech is clear and coherent with logical content.  Patient is alert and oriented at baseline.  Right great toe erythemas, swollen and tender  Assessment and Plan: 1. Acute drug-induced gout involving toe of right foot - colchicine 0.6 MG tablet; Take 2 tabs immediately, then 1 tab twice per day for the duration of the flare up to a max of 7 days  Dispense: 21 tablet; Refill: 0  Start colchicine Can have motrin as needed Recommend she talk to PCP about changing current BP medication since having gout flare up at least 2 times a year Force fluids Low purine diet Follow up if symptoms worsen or do not improve    Follow Up Instructions: I discussed the assessment and treatment plan with the patient. The patient was provided an opportunity to ask questions and all were answered. The patient agreed with the plan and demonstrated an understanding of the instructions.  A copy of instructions were sent to the patient via MyChart unless otherwise noted below.     The patient was advised to call back or seek an in-person evaluation if the symptoms worsen or if the condition fails to improve as anticipated.  Time:  I spent 9 minutes with the patient via telehealth technology discussing the above problems/concerns.    [name], FNP

## 2022-11-17 NOTE — Telephone Encounter (Signed)
Appt made

## 2022-11-18 ENCOUNTER — Ambulatory Visit: Payer: 59 | Admitting: Family Medicine

## 2022-11-18 ENCOUNTER — Encounter: Payer: Self-pay | Admitting: Nurse Practitioner

## 2023-03-01 ENCOUNTER — Encounter: Payer: Self-pay | Admitting: Nurse Practitioner

## 2023-03-01 ENCOUNTER — Ambulatory Visit (INDEPENDENT_AMBULATORY_CARE_PROVIDER_SITE_OTHER): Payer: 59 | Admitting: Nurse Practitioner

## 2023-03-01 VITALS — BP 102/72 | HR 70 | Temp 97.5°F | Resp 20 | Ht 67.0 in | Wt 170.0 lb

## 2023-03-01 DIAGNOSIS — F419 Anxiety disorder, unspecified: Secondary | ICD-10-CM | POA: Diagnosis not present

## 2023-03-01 DIAGNOSIS — Z6827 Body mass index (BMI) 27.0-27.9, adult: Secondary | ICD-10-CM

## 2023-03-01 DIAGNOSIS — K900 Celiac disease: Secondary | ICD-10-CM | POA: Diagnosis not present

## 2023-03-01 DIAGNOSIS — Z23 Encounter for immunization: Secondary | ICD-10-CM | POA: Diagnosis not present

## 2023-03-01 DIAGNOSIS — K219 Gastro-esophageal reflux disease without esophagitis: Secondary | ICD-10-CM

## 2023-03-01 DIAGNOSIS — E785 Hyperlipidemia, unspecified: Secondary | ICD-10-CM | POA: Diagnosis not present

## 2023-03-01 DIAGNOSIS — E039 Hypothyroidism, unspecified: Secondary | ICD-10-CM | POA: Diagnosis not present

## 2023-03-01 DIAGNOSIS — M810 Age-related osteoporosis without current pathological fracture: Secondary | ICD-10-CM | POA: Diagnosis not present

## 2023-03-01 DIAGNOSIS — I10 Essential (primary) hypertension: Secondary | ICD-10-CM

## 2023-03-01 MED ORDER — LEVOTHYROXINE SODIUM 75 MCG PO TABS: 75.0000 ug | ORAL_TABLET | Freq: Every day | ORAL | 1 refills | Status: DC

## 2023-03-01 MED ORDER — EZETIMIBE 10 MG PO TABS
10.0000 mg | ORAL_TABLET | Freq: Every day | ORAL | 1 refills | Status: DC
Start: 2023-03-01 — End: 2023-08-27

## 2023-03-01 MED ORDER — TRIAMTERENE-HCTZ 37.5-25 MG PO TABS
1.0000 | ORAL_TABLET | Freq: Every day | ORAL | 1 refills | Status: DC
Start: 2023-03-01 — End: 2023-08-27

## 2023-03-01 MED ORDER — LORAZEPAM 0.5 MG PO TABS: 0.5000 mg | ORAL_TABLET | Freq: Two times a day (BID) | ORAL | 1 refills | Status: DC | PRN

## 2023-03-01 NOTE — Patient Instructions (Signed)

## 2023-03-01 NOTE — Progress Notes (Signed)
Subjective:    Patient ID: Maria [name], female    DOB: 11/19/1960, 62 y.o.   MRN: [id number]   Chief Complaint: Medical Management of Chronic Issues    HPI:  Maria [name] is a 62 y.o. who identifies as a female who was assigned female at birth.   Social history: Lives with: husband Work history: retired   Water engineer in today for follow up of the following chronic medical issues:  1. Essential hypertension No c/o chest pain,sob or headache. Does not check blood pressure at home. Is on maxide and wants  to change to something else. Gets cramps often and she thinks is coming from meds. BP Readings from Last 3 Encounters:  03/01/23 102/72  08/27/22 99/65  05/27/22 (!) 105/58     2. Hyperlipidemia with target LDL less than 100 Does try to watch diet and stays very active. Lab Results  Component Value Date   CHOL 162 08/27/2022   HDL 45 08/27/2022   LDLCALC 95 08/27/2022   TRIG 121 08/27/2022   CHOLHDL 3.6 08/27/2022     3. CELIAC DISEASE Watches diet to prevent flare ups  4. Gastroesophageal reflux disease without esophagitis Only uses OTC meds as needed  5. Osteoporosis without current pathological fracture, unspecified osteoporosis type Last dexascan as done on 08/28/22. T score was -2.5. patient is currently on fosamax daily  6. BMI 27.0-27.9,adult No recent weight changes Wt Readings from Last 3 Encounters:  03/01/23 170 lb (77.1 kg)  08/27/22 168 lb (76.2 kg)  05/27/22 174 lb (78.9 kg)   BMI Readings from Last 3 Encounters:  03/01/23 26.63 kg/m  08/27/22 26.31 kg/m  05/27/22 27.25 kg/m      New complaints: None today  Allergies  Allergen Reactions   Claritin [Loratadine] Nausea And Vomiting   Livalo [Pitavastatin]     myopathy   Outpatient Encounter Medications as of 03/01/2023  Medication Sig   alendronate (FOSAMAX) 70 MG tablet Take 1 tablet (70 mg total) by mouth every 7 (seven) days. Take with a full glass of water on an empty stomach.    colchicine 0.6 MG tablet Take 2 tabs immediately, then 1 tab twice per day for the duration of the flare up to a max of 7 days   Cyanocobalamin (VITAMIN B 12 PO) Take by mouth.   ezetimibe (ZETIA) 10 MG tablet Take 1 tablet (10 mg total) by mouth daily.   levothyroxine (SYNTHROID) 75 MCG tablet Take 1 tablet (75 mcg total) by mouth daily.   LORazepam (ATIVAN) 0.5 MG tablet Take 1 tablet (0.5 mg total) by mouth 2 (two) times daily as needed for anxiety.   triamterene-hydrochlorothiazide (MAXZIDE-25) 37.5-25 MG tablet Take 1 tablet by mouth daily.   No facility-administered encounter medications on file as of 03/01/2023.    Past Surgical History:  Procedure Laterality Date   ELBOW FRACTURE SURGERY Left    INNER EAR SURGERY Left    x 2   TUBAL LIGATION      Family History  Problem Relation Age of Onset   COPD Mother    Heart disease Mother        MI   Diabetes Mother    Colon cancer Father 48   Diabetes Father    Heart disease Father    Osteoporosis Sister    Other Sister        Fatty liver   Diabetes Brother    Breast cancer Maternal Grandmother    Glaucoma Paternal Grandmother    Breast  cancer Paternal Aunt    Esophageal cancer Neg Hx    Rectal cancer Neg Hx    Stomach cancer Neg Hx       Controlled substance contract: n/a     Review of Systems  Constitutional:  Negative for diaphoresis.  Eyes:  Negative for pain.  Respiratory:  Negative for shortness of breath.   Cardiovascular:  Negative for chest pain, palpitations and leg swelling.  Gastrointestinal:  Negative for abdominal pain.  Endocrine: Negative for polydipsia.  Skin:  Negative for rash.  Neurological:  Negative for dizziness, weakness and headaches.  Hematological:  Does not bruise/bleed easily.  All other systems reviewed and are negative.      Objective:   Physical Exam Vitals and nursing note reviewed.  Constitutional:      General: She is not in acute distress.    Appearance: Normal  appearance. She is well-developed.  HENT:     Head: Normocephalic.     Right Ear: Tympanic membrane normal.     Left Ear: Tympanic membrane normal.     Nose: Nose normal.     Mouth/Throat:     Mouth: Mucous membranes are moist.  Eyes:     Pupils: Pupils are equal, round, and reactive to light.  Neck:     Vascular: No carotid bruit or JVD.  Cardiovascular:     Rate and Rhythm: Normal rate and regular rhythm.     Heart sounds: Normal heart sounds.  Pulmonary:     Effort: Pulmonary effort is normal. No respiratory distress.     Breath sounds: Normal breath sounds. No wheezing or rales.  Chest:     Chest wall: No tenderness.  Abdominal:     General: Bowel sounds are normal. There is no distension or abdominal bruit.     Palpations: Abdomen is soft. There is no hepatomegaly, splenomegaly, mass or pulsatile mass.     Tenderness: There is no abdominal tenderness.  Musculoskeletal:        General: Normal range of motion.     Cervical back: Normal range of motion and neck supple.  Lymphadenopathy:     Cervical: No cervical adenopathy.  Skin:    General: Skin is warm and dry.  Neurological:     Mental Status: She is alert and oriented to person, place, and time.     Deep Tendon Reflexes: Reflexes are normal and symmetric.  Psychiatric:        Behavior: Behavior normal.        Thought Content: Thought content normal.        Judgment: Judgment normal.    BP 102/72   Pulse 70   Temp (!) 97.5 F (36.4 C) (Temporal)   Resp 20   Ht 5\' 7"  (1.702 m)   Wt 170 lb (77.1 kg)   SpO2 96%   BMI 26.63 kg/m         Assessment & Plan:   Maria [name] comes in today with chief complaint of Medical Management of Chronic Issues   Diagnosis and orders addressed:  1. Essential hypertension Low sodium diet - triamterene-hydrochlorothiazide (MAXZIDE-25) 37.5-25 MG tablet; Take 1 tablet by mouth daily.  Dispense: 90 tablet; Refill: 1 - CBC with Differential/Platelet - CMP14+EGFR  2.  Hyperlipidemia with target LDL less than 100 Low fat diet - ezetimibe (ZETIA) 10 MG tablet; Take 1 tablet (10 mg total) by mouth daily.  Dispense: 90 tablet; Refill: 1 - Lipid panel  3. CELIAC DISEASE Continue to watch diet  4. Gastroesophageal reflux disease without esophagitis Avoid spicy foods Do not eat 2 hours prior to bedtime   5. Osteoporosis without current pathological fracture, unspecified osteoporosis type Weight bearing exercises  6. BMI 27.0-27.9,adult Discussed diet and exercise for person with BMI >25 Will recheck weight in 3-6 months   7. Acquired hypothyroidism Labs pending - levothyroxine (SYNTHROID) 75 MCG tablet; Take 1 tablet (75 mcg total) by mouth daily.  Dispense: 90 tablet; Refill: 1 - Thyroid Panel With TSH  8. Anxiety Stress management - LORazepam (ATIVAN) 0.5 MG tablet; Take 1 tablet (0.5 mg total) by mouth 2 (two) times daily as needed for anxiety.  Dispense: 30 tablet; Refill: 1   Labs pending Health Maintenance reviewed Diet and exercise encouraged  Follow up plan: 6 months   Mary-Margaret Daphine Deutscher, FNP

## 2023-03-09 ENCOUNTER — Telehealth (INDEPENDENT_AMBULATORY_CARE_PROVIDER_SITE_OTHER): Payer: 59 | Admitting: Nurse Practitioner

## 2023-03-09 ENCOUNTER — Encounter: Payer: Self-pay | Admitting: Nurse Practitioner

## 2023-03-09 DIAGNOSIS — U071 COVID-19: Secondary | ICD-10-CM

## 2023-03-09 NOTE — Patient Instructions (Signed)
  Maria [name], thank you for joining [name], FNP for today's virtual visit.  While this provider is not your primary care provider (PCP), if your PCP is located in our provider database this encounter information will be shared with them immediately following your visit.   A [name] MyChart account gives you access to today's visit and all your visits, tests, and labs performed at [facility] " click here if you don't have a [name] MyChart account or go to [email or web address]  Consent: (Patient) Maria [name] provided verbal consent for this virtual visit at the beginning of the encounter.  Current Medications:  Current Outpatient Medications:    alendronate (FOSAMAX) 70 MG tablet, Take 1 tablet (70 mg total) by mouth every 7 (seven) days. Take with a full glass of water on an empty stomach., Disp: 4 tablet, Rfl: 11   colchicine 0.6 MG tablet, Take 2 tabs immediately, then 1 tab twice per day for the duration of the flare up to a max of 7 days, Disp: 21 tablet, Rfl: 0   Cyanocobalamin (VITAMIN B 12 PO), Take by mouth., Disp: , Rfl:    ezetimibe (ZETIA) 10 MG tablet, Take 1 tablet (10 mg total) by mouth daily., Disp: 90 tablet, Rfl: 1   levothyroxine (SYNTHROID) 75 MCG tablet, Take 1 tablet (75 mcg total) by mouth daily., Disp: 90 tablet, Rfl: 1   LORazepam (ATIVAN) 0.5 MG tablet, Take 1 tablet (0.5 mg total) by mouth 2 (two) times daily as needed for anxiety., Disp: 30 tablet, Rfl: 1   triamterene-hydrochlorothiazide (MAXZIDE-25) 37.5-25 MG tablet, Take 1 tablet by mouth daily., Disp: 90 tablet, Rfl: 1   Medications ordered in this encounter:  No orders of the defined types were placed in this encounter.    *If you need refills on other medications prior to your next appointment, please contact your pharmacy*  Follow-Up: Call back or seek an in-person evaluation if the symptoms worsen or if the condition fails to improve as anticipated.  [facility]  Virtual Care [phone]  Other Instructions 1. Take meds as prescribed 2. Use a cool mist humidifier especially during the winter months and when heat has been humid. 3. Use saline nose sprays frequently 4. Saline irrigations of the nose can be very helpful if done frequently.  * 4X daily for 1 week*  * Use of a nettie pot can be helpful with this. Follow directions with this* 5. Drink plenty of fluids 6. Keep thermostat turn down low 7.For any cough or congestion- mucinex or delsym 8. For fever or aces or pains- take tylenol or ibuprofen appropriate for age and weight.  * for fevers greater than 101 orally you may alternate ibuprofen and tylenol every  3 hours.      If you have been instructed to have an in-person evaluation today at a local Urgent Care facility, please use the link below. It will take you to a list of all of our available [facility], including address, phone number and hours of operation. Please do not delay care.  [facility]  If you or a family member do not have a primary care provider, use the link below to schedule a visit and establish care. When you choose a [facility] primary care physician or advanced practice provider, you gain a long-term partner in health. Find a Primary Care Provider  Learn more about [facility]'s in-office and virtual care options: [facility] - Get Care Now

## 2023-03-09 NOTE — Progress Notes (Signed)
Virtual Visit Consent   Maria [name], you are scheduled for a virtual visit with Maria Daphine Deutscher, FNP, a [facility] provider, today.     Just as with appointments in the office, your consent must be obtained to participate.  Your consent will be active for this visit and any virtual visit you may have with one of our providers in the next 365 days.     If you have a MyChart account, a copy of this consent can be sent to you electronically.  All virtual visits are billed to your insurance company just like a traditional visit in the office.    As this is a virtual visit, video technology does not allow for your provider to perform a traditional examination.  This may limit your provider's ability to fully assess your condition.  If your provider identifies any concerns that need to be evaluated in person or the need to arrange testing (such as labs, EKG, etc.), we will make arrangements to do so.     Although advances in technology are sophisticated, we cannot ensure that it will always work on either your end or our end.  If the connection with a video visit is poor, the visit may have to be switched to a telephone visit.  With either a video or telephone visit, we are not always able to ensure that we have a secure connection.     I need to obtain your verbal consent now.   Are you willing to proceed with your visit today? YES   Maria [name] has provided verbal consent on 03/09/2023 for a virtual visit (video or telephone).   Maria Daphine Deutscher, FNP   Date: 03/09/2023 1:07 PM   Virtual Visit via Video Note   I, Maria [name], connected with Maria [name] ([id number], 04-03-1961) on 03/09/23 at  5:00 PM EDT by a video-enabled telemedicine application and verified that I am speaking with the correct person using two identifiers.  Location: Patient: Virtual Visit Location Patient: Home Provider: Virtual Visit Location Provider: Mobile   I discussed the limitations of evaluation  and management by telemedicine and the availability of in person appointments. The patient expressed understanding and agreed to proceed.    History of Present Illness: Maria [name] is a 62 y.o. who identifies as a female who was assigned female at birth, and is being seen today for covid positive.  HPI: URI  This is a new problem. The current episode started yesterday. The problem has been waxing and waning. There has been no fever. Associated symptoms include congestion, coughing, headaches and rhinorrhea. She has tried acetaminophen for the symptoms. The treatment provided mild relief.    Review of Systems  HENT:  Positive for congestion and rhinorrhea.   Respiratory:  Positive for cough.   Neurological:  Positive for headaches.    Problems:  Patient Active Problem List   Diagnosis Date Noted   Osteoporosis 09/20/2015   BMI 27.0-27.9,adult 09/10/2015   Essential hypertension 07/19/2014   Hyperlipidemia with target LDL less than 100 07/19/2014   External hemorrhoids 07/23/2008   GERD 07/23/2008   CELIAC DISEASE 07/23/2008   COLONIC POLYPS, HX OF 07/23/2008    Allergies:  Allergies  Allergen Reactions   Claritin [Loratadine] Nausea And Vomiting   Livalo [Pitavastatin]     myopathy   Medications:  Current Outpatient Medications:    alendronate (FOSAMAX) 70 MG tablet, Take 1 tablet (70 mg total) by mouth every 7 (seven) days. Take with a full glass of  water on an empty stomach., Disp: 4 tablet, Rfl: 11   colchicine 0.6 MG tablet, Take 2 tabs immediately, then 1 tab twice per day for the duration of the flare up to a max of 7 days, Disp: 21 tablet, Rfl: 0   Cyanocobalamin (VITAMIN B 12 PO), Take by mouth., Disp: , Rfl:    ezetimibe (ZETIA) 10 MG tablet, Take 1 tablet (10 mg total) by mouth daily., Disp: 90 tablet, Rfl: 1   levothyroxine (SYNTHROID) 75 MCG tablet, Take 1 tablet (75 mcg total) by mouth daily., Disp: 90 tablet, Rfl: 1   LORazepam (ATIVAN) 0.5 MG tablet, Take 1  tablet (0.5 mg total) by mouth 2 (two) times daily as needed for anxiety., Disp: 30 tablet, Rfl: 1   triamterene-hydrochlorothiazide (MAXZIDE-25) 37.5-25 MG tablet, Take 1 tablet by mouth daily., Disp: 90 tablet, Rfl: 1  Observations/Objective: Patient is well-developed, well-nourished in no acute distress.  Resting comfortably  at home.  Head is normocephalic, atraumatic.  No labored breathing.  Speech is clear and coherent with logical content.  Patient is alert and oriented at baseline.  Raspy voice No cough during visit  Assessment and Plan:  Maria [name] in today with chief complaint of Covid Positive   1. Positive self-administered antigen test for COVID-19 1. Take meds as prescribed 2. Use a cool mist humidifier especially during the winter months and when heat has been humid. 3. Use saline nose sprays frequently 4. Saline irrigations of the nose can be very helpful if done frequently.  * 4X daily for 1 week*  * Use of a nettie pot can be helpful with this. Follow directions with this* 5. Drink plenty of fluids 6. Keep thermostat turn down low 7.For any cough or congestion- mucinex or delsym 8. For fever or aces or pains- take tylenol or ibuprofen appropriate for age and weight.  * for fevers greater than 101 orally you may alternate ibuprofen and tylenol every  3 hours.      Follow Up Instructions: I discussed the assessment and treatment plan with the patient. The patient was provided an opportunity to ask questions and all were answered. The patient agreed with the plan and demonstrated an understanding of the instructions.  A copy of instructions were sent to the patient via MyChart.  The patient was advised to call back or seek an in-person evaluation if the symptoms worsen or if the condition fails to improve as anticipated.  Time:  I spent 6 minutes with the patient via telehealth technology discussing the above problems/concerns.    Maria Daphine Deutscher, FNP

## 2023-03-11 ENCOUNTER — Telehealth: Payer: Self-pay | Admitting: Nurse Practitioner

## 2023-03-11 MED ORDER — NIRMATRELVIR/RITONAVIR (PAXLOVID)TABLET: 3.0000 | ORAL_TABLET | Freq: Two times a day (BID) | ORAL | 0 refills | Status: AC

## 2023-03-11 NOTE — Telephone Encounter (Signed)
Pt called to let MMM know that she is starting to feel worse than what she was feeling the other day at her appt and does want MMM to go ahead and send her in some medicine. Symptoms are congested head, runny nose, and coughing.   Pt uses Psychologist, forensic in [place].

## 2023-03-11 NOTE — Telephone Encounter (Signed)
Paxlovid sent to pharmacy  Meds ordered this encounter  Medications   nirmatrelvir/ritonavir (PAXLOVID) 20 x 150 MG & 10 x 100MG  TABS    Sig: Take 3 tablets by mouth 2 (two) times daily for 5 days. (Take nirmatrelvir 150 mg two tablets twice daily for 5 days and ritonavir 100 mg one tablet twice daily for 5 days) Patient GFR is 82    Dispense:  30 tablet    Refill:  0    Order Specific Question:   Supervising Provider    Answer:   Raliegh Ip [[id number]]   Mary-Margaret Daphine Deutscher, FNP

## 2023-03-11 NOTE — Telephone Encounter (Signed)
Patient notified and verbalized understanding. 

## 2023-05-31 DIAGNOSIS — H40053 Ocular hypertension, bilateral: Secondary | ICD-10-CM | POA: Diagnosis not present

## 2023-05-31 DIAGNOSIS — H43811 Vitreous degeneration, right eye: Secondary | ICD-10-CM | POA: Diagnosis not present

## 2023-05-31 DIAGNOSIS — H16423 Pannus (corneal), bilateral: Secondary | ICD-10-CM | POA: Diagnosis not present

## 2023-05-31 DIAGNOSIS — H2513 Age-related nuclear cataract, bilateral: Secondary | ICD-10-CM | POA: Diagnosis not present

## 2023-05-31 DIAGNOSIS — D3132 Benign neoplasm of left choroid: Secondary | ICD-10-CM | POA: Diagnosis not present

## 2023-07-23 ENCOUNTER — Other Ambulatory Visit: Payer: Self-pay | Admitting: *Deleted

## 2023-07-23 MED ORDER — ALENDRONATE SODIUM 70 MG PO TABS: 70.0000 mg | ORAL_TABLET | ORAL | 0 refills | Status: DC

## 2023-08-02 ENCOUNTER — Encounter: Payer: Self-pay | Admitting: Gastroenterology

## 2023-08-17 ENCOUNTER — Other Ambulatory Visit: Payer: Self-pay | Admitting: *Deleted

## 2023-08-17 MED ORDER — ALENDRONATE SODIUM 70 MG PO TABS: 70.0000 mg | ORAL_TABLET | ORAL | 0 refills | Status: DC

## 2023-08-27 ENCOUNTER — Other Ambulatory Visit (HOSPITAL_COMMUNITY)
Admission: RE | Admit: 2023-08-27 | Discharge: 2023-08-27 | Disposition: A | Discharge status: Home / Self Care | Source: Ambulatory Visit | Attending: Nurse Practitioner | Admitting: Nurse Practitioner

## 2023-08-27 ENCOUNTER — Ambulatory Visit: Payer: 59 | Admitting: Nurse Practitioner

## 2023-08-27 ENCOUNTER — Encounter: Payer: Self-pay | Admitting: Nurse Practitioner

## 2023-08-27 VITALS — BP 98/65 | HR 80 | Temp 97.3°F | Ht 67.0 in | Wt 175.0 lb

## 2023-08-27 DIAGNOSIS — K900 Celiac disease: Secondary | ICD-10-CM

## 2023-08-27 DIAGNOSIS — E039 Hypothyroidism, unspecified: Secondary | ICD-10-CM

## 2023-08-27 DIAGNOSIS — K219 Gastro-esophageal reflux disease without esophagitis: Secondary | ICD-10-CM

## 2023-08-27 DIAGNOSIS — Z0000 Encounter for general adult medical examination without abnormal findings: Secondary | ICD-10-CM

## 2023-08-27 DIAGNOSIS — E785 Hyperlipidemia, unspecified: Secondary | ICD-10-CM | POA: Diagnosis not present

## 2023-08-27 DIAGNOSIS — I10 Essential (primary) hypertension: Secondary | ICD-10-CM | POA: Diagnosis not present

## 2023-08-27 DIAGNOSIS — F419 Anxiety disorder, unspecified: Secondary | ICD-10-CM

## 2023-08-27 DIAGNOSIS — M810 Age-related osteoporosis without current pathological fracture: Secondary | ICD-10-CM

## 2023-08-27 DIAGNOSIS — R7303 Prediabetes: Secondary | ICD-10-CM

## 2023-08-27 DIAGNOSIS — Z6827 Body mass index (BMI) 27.0-27.9, adult: Secondary | ICD-10-CM

## 2023-08-27 DIAGNOSIS — Z0001 Encounter for general adult medical examination with abnormal findings: Secondary | ICD-10-CM

## 2023-08-27 LAB — BAYER DCA HB A1C WAIVED: HB A1C (BAYER DCA - WAIVED): 5.5 % (ref 4.8–5.6)

## 2023-08-27 MED ORDER — TRIAMTERENE-HCTZ 37.5-25 MG PO TABS: 1.0000 | ORAL_TABLET | Freq: Every day | ORAL | 1 refills | Status: DC

## 2023-08-27 MED ORDER — LORAZEPAM 0.5 MG PO TABS: 0.5000 mg | ORAL_TABLET | Freq: Two times a day (BID) | ORAL | 1 refills | Status: DC | PRN

## 2023-08-27 MED ORDER — EZETIMIBE 10 MG PO TABS: 10.0000 mg | ORAL_TABLET | Freq: Every day | ORAL | 1 refills | Status: DC

## 2023-08-27 MED ORDER — ALENDRONATE SODIUM 70 MG PO TABS
70.0000 mg | ORAL_TABLET | ORAL | 1 refills | Status: DC
Start: 2023-08-27 — End: 2023-12-02

## 2023-08-27 MED ORDER — LEVOTHYROXINE SODIUM 75 MCG PO TABS: 75.0000 ug | ORAL_TABLET | Freq: Every day | ORAL | 1 refills | Status: DC

## 2023-08-27 NOTE — Patient Instructions (Signed)
 Exercising to Stay Healthy To become healthy and stay healthy, it is recommended that you do moderate-intensity and vigorous-intensity exercise. You can tell that you are exercising at a moderate intensity if your heart starts beating faster and you start breathing faster but can still hold a conversation. You can tell that you are exercising at a vigorous intensity if you are breathing much harder and faster and cannot hold a conversation while exercising. How can exercise benefit me? Exercising regularly is important. It has many health benefits, such as: Improving overall fitness, flexibility, and endurance. Increasing bone density. Helping with weight control. Decreasing body fat. Increasing muscle strength and endurance. Reducing stress and tension, anxiety, depression, or anger. Improving overall health. What guidelines should I follow while exercising? Before you start a new exercise program, talk with your health care provider. Do not exercise so much that you hurt yourself, feel dizzy, or get very short of breath. Wear comfortable clothes and wear shoes with good support. Drink plenty of water while you exercise to prevent dehydration or heat stroke. Work out until your breathing and your heartbeat get faster (moderate intensity). How often should I exercise? Choose an activity that you enjoy, and set realistic goals. Your health care provider can help you make an activity plan that is individually designed and works best for you. Exercise regularly as told by your health care provider. This may include: Doing strength training two times a week, such as: Lifting weights. Using resistance bands. Push-ups. Sit-ups. Yoga. Doing a certain intensity of exercise for a given amount of time. Choose from these options: A total of 150 minutes of moderate-intensity exercise every week. A total of 75 minutes of vigorous-intensity exercise every week. A mix of moderate-intensity and  vigorous-intensity exercise every week. Children, pregnant women, people who have not exercised regularly, people who are overweight, and older adults may need to talk with a health care provider about what activities are safe to perform. If you have a medical condition, be sure to talk with your health care provider before you start a new exercise program. What are some exercise ideas? Moderate-intensity exercise ideas include: Walking 1 mile (1.6 km) in about 15 minutes. Biking. Hiking. Golfing. Dancing. Water aerobics. Vigorous-intensity exercise ideas include: Walking 4.5 miles (7.2 km) or more in about 1 hour. Jogging or running 5 miles (8 km) in about 1 hour. Biking 10 miles (16.1 km) or more in about 1 hour. Lap swimming. Roller-skating or in-line skating. Cross-country skiing. Vigorous competitive sports, such as football, basketball, and soccer. Jumping rope. Aerobic dancing. What are some everyday activities that can help me get exercise? Yard work, such as: Child psychotherapist. Raking and bagging leaves. Washing your car. Pushing a stroller. Shoveling snow. Gardening. Washing windows or floors. How can I be more active in my day-to-day activities? Use stairs instead of an elevator. Take a walk during your lunch break. If you drive, park your car farther away from your work or school. If you take public transportation, get off one stop early and walk the rest of the way. Stand up or walk around during all of your indoor phone calls. Get up, stretch, and walk around every 30 minutes throughout the day. Enjoy exercise with a friend. Support to continue exercising will help you keep a regular routine of activity. Where to find more information You can find more information about exercising to stay healthy from: U.S. Department of Health and Human Services: ThisPath.fi Centers for Disease Control and Prevention (  CDC): FootballExhibition.com.br Summary Exercising regularly is  important. It will improve your overall fitness, flexibility, and endurance. Regular exercise will also improve your overall health. It can help you control your weight, reduce stress, and improve your bone density. Do not exercise so much that you hurt yourself, feel dizzy, or get very short of breath. Before you start a new exercise program, talk with your health care provider. This information is not intended to replace advice given to you by your health care provider. Make sure you discuss any questions you have with your health care provider. Document Revised: 09/27/2020 Document Reviewed: 09/27/2020 Elsevier Patient Education  2024 [employer].

## 2023-08-27 NOTE — Progress Notes (Addendum)
 Subjective:    Patient ID: Maria [name], female    DOB: Dec 06, 1960, 63 y.o.   MRN: [id number]   Chief Complaint: annual physical   HPI:  Maria [name] is a 63 y.o. who identifies as a female who was assigned female at birth.   Social history: Lives with: husband Work history: retired   Water engineer in today for follow up of the following chronic medical issues:  1. Annual physical Pap today  2. hypertension No c/o chest pain,sob or headache. Does not check blood pressure at home. Is on maxide and wants  to change to something else. Gets cramps often and she thinks is coming from meds. BP Readings from Last 3 Encounters:  03/01/23 102/72  08/27/22 99/65  05/27/22 (!) 105/58     3. Hyperlipidemia with target LDL less than 100 Does try to watch diet and stays very active. Lab Results  Component Value Date   CHOL 162 08/27/2022   HDL 45 08/27/2022   LDLCALC 95 08/27/2022   TRIG 121 08/27/2022   CHOLHDL 3.6 08/27/2022     4. CELIAC DISEASE Watches diet to prevent flare ups  45 Gastroesophageal reflux disease without esophagitis Only uses OTC meds as needed  6. Osteoporosis without current pathological fracture, unspecified osteoporosis type Last dexascan as done on 08/28/22. T score was -2.5. patient is currently on fosamax daily  7. BMI 27.0-27.9,adult Weight is up 5lbs  Wt Readings from Last 3 Encounters:  08/27/23 175 lb (79.4 kg)  03/01/23 170 lb (77.1 kg)  08/27/22 168 lb (76.2 kg)   BMI Readings from Last 3 Encounters:  08/27/23 27.41 kg/m  03/01/23 26.63 kg/m  08/27/22 26.31 kg/m       New complaints: None today  Allergies  Allergen Reactions   Claritin [Loratadine] Nausea And Vomiting   Livalo [Pitavastatin]     myopathy   Outpatient Encounter Medications as of 08/27/2023  Medication Sig   alendronate (FOSAMAX) 70 MG tablet Take 1 tablet (70 mg total) by mouth every 7 (seven) days. Take with a full glass of water on an empty stomach.    colchicine 0.6 MG tablet Take 2 tabs immediately, then 1 tab twice per day for the duration of the flare up to a max of 7 days   Cyanocobalamin (VITAMIN B 12 PO) Take by mouth.   ezetimibe (ZETIA) 10 MG tablet Take 1 tablet (10 mg total) by mouth daily.   levothyroxine (SYNTHROID) 75 MCG tablet Take 1 tablet (75 mcg total) by mouth daily.   LORazepam (ATIVAN) 0.5 MG tablet Take 1 tablet (0.5 mg total) by mouth 2 (two) times daily as needed for anxiety.   triamterene-hydrochlorothiazide (MAXZIDE-25) 37.5-25 MG tablet Take 1 tablet by mouth daily.   No facility-administered encounter medications on file as of 08/27/2023.    Past Surgical History:  Procedure Laterality Date   ELBOW FRACTURE SURGERY Left    INNER EAR SURGERY Left    x 2   TUBAL LIGATION      Family History  Problem Relation Age of Onset   COPD Mother    Heart disease Mother        MI   Diabetes Mother    Colon cancer Father 51   Diabetes Father    Heart disease Father    Osteoporosis Sister    Other Sister        Fatty liver   Diabetes Brother    Breast cancer Maternal Grandmother    Glaucoma Paternal Grandmother  Breast cancer Paternal Aunt    Esophageal cancer Neg Hx    Rectal cancer Neg Hx    Stomach cancer Neg Hx       Controlled substance contract: n/a     Review of Systems  Constitutional:  Negative for diaphoresis.  Eyes:  Negative for pain.  Respiratory:  Negative for shortness of breath.   Cardiovascular:  Negative for chest pain, palpitations and leg swelling.  Gastrointestinal:  Negative for abdominal pain.  Endocrine: Negative for polydipsia.  Skin:  Negative for rash.  Neurological:  Negative for dizziness, weakness and headaches.  Hematological:  Does not bruise/bleed easily.  All other systems reviewed and are negative.      Objective:   Physical Exam Vitals and nursing note reviewed.  Constitutional:      General: She is not in acute distress.    Appearance: Normal  appearance. She is well-developed.  HENT:     Head: Normocephalic.     Right Ear: Tympanic membrane normal.     Left Ear: Tympanic membrane normal.     Nose: Nose normal.     Mouth/Throat:     Mouth: Mucous membranes are moist.  Eyes:     Pupils: Pupils are equal, round, and reactive to light.  Neck:     Vascular: No carotid bruit or JVD.  Cardiovascular:     Rate and Rhythm: Normal rate and regular rhythm.     Heart sounds: Normal heart sounds.  Pulmonary:     Effort: Pulmonary effort is normal. No respiratory distress.     Breath sounds: Normal breath sounds. No wheezing or rales.  Chest:     Chest wall: No tenderness.  Abdominal:     General: Bowel sounds are normal. There is no distension or abdominal bruit.     Palpations: Abdomen is soft. There is no hepatomegaly, splenomegaly, mass or pulsatile mass.     Tenderness: There is no abdominal tenderness.  Musculoskeletal:        General: Normal range of motion.     Cervical back: Normal range of motion and neck supple.  Lymphadenopathy:     Cervical: No cervical adenopathy.  Skin:    General: Skin is warm and dry.  Neurological:     Mental Status: She is alert and oriented to person, place, and time.     Deep Tendon Reflexes: Reflexes are normal and symmetric.  Psychiatric:        Behavior: Behavior normal.        Thought Content: Thought content normal.        Judgment: Judgment normal.    BP 98/65   Pulse 80   Temp (!) 97.3 F (36.3 C) (Temporal)   Ht 5\' 7"  (1.702 m)   Wt 175 lb (79.4 kg)   SpO2 98%   BMI 27.41 kg/m   Hgba1c 5.5%       Assessment & Plan:   Maria [name] comes in today with chief complaint of annual physical  Diagnosis and orders addressed:  1. Essential hypertension Low sodium diet - triamterene-hydrochlorothiazide (MAXZIDE-25) 37.5-25 MG tablet; Take 1 tablet by mouth daily.  Dispense: 90 tablet; Refill: 1 - CBC with Differential/Platelet - CMP14+EGFR  2. Hyperlipidemia with  target LDL less than 100 Low fat diet - ezetimibe (ZETIA) 10 MG tablet; Take 1 tablet (10 mg total) by mouth daily.  Dispense: 90 tablet; Refill: 1 - Lipid panel  3. CELIAC DISEASE Continue to watch diet  4. Gastroesophageal reflux disease without  esophagitis Avoid spicy foods Do not eat 2 hours prior to bedtime   5. Osteoporosis without current pathological fracture, unspecified osteoporosis type Weight bearing exercises  6. BMI 27.0-27.9,adult Discussed diet and exercise for person with BMI >25 Will recheck weight in 3-6 months   7. Acquired hypothyroidism Labs pending - levothyroxine (SYNTHROID) 75 MCG tablet; Take 1 tablet (75 mcg total) by mouth daily.  Dispense: 90 tablet; Refill: 1 - Thyroid Panel With TSH  8. Anxiety Stress management - LORazepam (ATIVAN) 0.5 MG tablet; Take 1 tablet (0.5 mg total) by mouth 2 (two) times daily as needed for anxiety.  Dispense: 30 tablet; Refill: 1   Labs pending Health Maintenance reviewed Diet and exercise encouraged  Follow up plan: 6 months   Maria Daphine Deutscher, FNP

## 2023-08-28 LAB — CMP14+EGFR
ALT: 24 IU/L (ref 0–32)
AST: 26 IU/L (ref 0–40)
Albumin: 4.3 g/dL (ref 3.9–4.9)
Alkaline Phosphatase: 69 IU/L (ref 44–121)
BUN/Creatinine Ratio: 18 (ref 12–28)
BUN: 16 mg/dL (ref 8–27)
Bilirubin Total: 0.5 mg/dL (ref 0.0–1.2)
CO2: 25 mmol/L (ref 20–29)
Calcium: 8.8 mg/dL (ref 8.7–10.3)
Chloride: 103 mmol/L (ref 96–106)
Creatinine, Ser: 0.91 mg/dL (ref 0.57–1.00)
Globulin, Total: 2.4 g/dL (ref 1.5–4.5)
Glucose: 99 mg/dL (ref 70–99)
Potassium: 3.8 mmol/L (ref 3.5–5.2)
Sodium: 142 mmol/L (ref 134–144)
Total Protein: 6.7 g/dL (ref 6.0–8.5)
eGFR: 71 mL/min/{1.73_m2} (ref >59)

## 2023-08-28 LAB — CBC WITH DIFFERENTIAL/PLATELET
Basophils Absolute: 0.1 10*3/uL (ref 0.0–0.2)
Basos: 1 %
EOS (ABSOLUTE): 0.3 10*3/uL (ref 0.0–0.4)
Eos: 4 %
Hematocrit: 45.8 % (ref 34.0–46.6)
Hemoglobin: 15.6 g/dL (ref 11.1–15.9)
Immature Grans (Abs): 0 10*3/uL (ref 0.0–0.1)
Immature Granulocytes: 0 %
Lymphocytes Absolute: 2.6 10*3/uL (ref 0.7–3.1)
Lymphs: 33 %
MCH: 32.6 pg (ref 26.6–33.0)
MCHC: 34.1 g/dL (ref 31.5–35.7)
MCV: 96 fL (ref 79–97)
Monocytes Absolute: 0.7 10*3/uL (ref 0.1–0.9)
Monocytes: 9 %
Neutrophils Absolute: 4.1 10*3/uL (ref 1.4–7.0)
Neutrophils: 53 %
Platelets: 337 10*3/uL (ref 150–450)
RBC: 4.78 x10E6/uL (ref 3.77–5.28)
RDW: 12.4 % (ref 11.7–15.4)
WBC: 7.8 10*3/uL (ref 3.4–10.8)

## 2023-08-28 LAB — LIPID PANEL
Chol/HDL Ratio: 3.5 ratio (ref 0.0–4.4)
Cholesterol, Total: 160 mg/dL (ref 100–199)
HDL: 46 mg/dL (ref >39)
LDL Chol Calc (NIH): 92 mg/dL (ref 0–99)
Triglycerides: 124 mg/dL (ref 0–149)
VLDL Cholesterol Cal: 22 mg/dL (ref 5–40)

## 2023-08-28 LAB — THYROID PANEL WITH TSH
Free Thyroxine Index: 2.5 (ref 1.2–4.9)
T3 Uptake Ratio: 24 % (ref 24–39)
T4, Total: 10.5 ug/dL (ref 4.5–12.0)
TSH: 1.65 u[IU]/mL (ref 0.450–4.500)

## 2023-08-28 LAB — VITAMIN D 25 HYDROXY (VIT D DEFICIENCY, FRACTURES): Vit D, 25-Hydroxy: 56.3 ng/mL (ref 30.0–100.0)

## 2023-08-29 LAB — DRUG SCREEN 10 W/CONF, SERUM
Amphetamines, IA: NEGATIVE ng/mL
Barbiturates, IA: NEGATIVE ug/mL
Benzodiazepines, IA: NEGATIVE ng/mL
Cocaine & Metabolite, IA: NEGATIVE ng/mL
Methadone, IA: NEGATIVE ng/mL
Opiates, IA: NEGATIVE ng/mL
Oxycodones, IA: NEGATIVE ng/mL
Phencyclidine, IA: NEGATIVE ng/mL
Propoxyphene, IA: NEGATIVE ng/mL
THC(Marijuana) Metabolite, IA: NEGATIVE ng/mL

## 2023-08-31 LAB — CYTOLOGY - PAP: Diagnosis: NEGATIVE

## 2023-11-15 ENCOUNTER — Ambulatory Visit: Admitting: Family

## 2023-11-16 ENCOUNTER — Ambulatory Visit: Admitting: Nurse Practitioner

## 2023-11-16 ENCOUNTER — Encounter: Payer: Self-pay | Admitting: Nurse Practitioner

## 2023-11-16 VITALS — BP 98/64 | HR 79 | Temp 98.2°F | Ht 67.0 in | Wt 173.0 lb

## 2023-11-16 DIAGNOSIS — M10072 Idiopathic gout, left ankle and foot: Secondary | ICD-10-CM | POA: Diagnosis not present

## 2023-11-16 MED ORDER — PREDNISONE 20 MG PO TABS: 40.0000 mg | ORAL_TABLET | Freq: Every day | ORAL | 0 refills | Status: AC

## 2023-11-16 NOTE — Progress Notes (Signed)
   Subjective:    Patient ID: Maria [name], female    DOB: 08/17/1960, 63 y.o.   MRN: [id number]   Chief Complaint: Swelling in foot   HPI  Patient in c/o of swelling and pain since Friday Left foot. . Rates pain 1/10 currently. Is worse at night and feels warm. Has history of gout. Patient Active Problem List   Diagnosis Date Noted   Osteoporosis 09/20/2015   BMI 27.0-27.9,adult 09/10/2015   Essential hypertension 07/19/2014   Hyperlipidemia with target LDL less than 100 07/19/2014   External hemorrhoids 07/23/2008   GERD 07/23/2008   CELIAC DISEASE 07/23/2008   History of colonic polyps 07/23/2008       Review of Systems  Constitutional:  Negative for diaphoresis.  Eyes:  Negative for pain.  Respiratory:  Negative for shortness of breath.   Cardiovascular:  Negative for chest pain, palpitations and leg swelling.  Gastrointestinal:  Negative for abdominal pain.  Endocrine: Negative for polydipsia.  Skin:  Negative for rash.  Neurological:  Negative for dizziness, weakness and headaches.  Hematological:  Does not bruise/bleed easily.  All other systems reviewed and are negative.      Objective:   Physical Exam Constitutional:      Appearance: Normal appearance.  Cardiovascular:     Rate and Rhythm: Normal rate and regular rhythm.  Pulmonary:     Breath sounds: Normal breath sounds.  Skin:    General: Skin is warm.  Neurological:     General: No focal deficit present.     Mental Status: She is alert and oriented to person, place, and time.  Psychiatric:        Mood and Affect: Mood normal.        Behavior: Behavior normal.     BP 98/64   Pulse 79   Temp 98.2 F (36.8 C) (Temporal)   Ht 5\' 7"  (1.702 m)   Wt 173 lb (78.5 kg)   SpO2 98%   BMI 27.10 kg/m        Assessment & Plan:   Maria [name] in today with chief complaint of Swelling in foot   1. Acute idiopathic gout of left foot (Primary) Epsom salt soaks Report any more flare ups because may  need to start on maintenance meds    ( allopurinol ) - Arthritis Panel - predniSONE  (DELTASONE ) 20 MG tablet; Take 2 tablets (40 mg total) by mouth daily with breakfast for 5 days. 2 po daily for 5 days  Dispense: 10 tablet; Refill: 0    The above assessment and management plan was discussed with the patient. The patient verbalized understanding of and has agreed to the management plan. Patient is aware to call the clinic if symptoms persist or worsen. Patient is aware when to return to the clinic for a follow-up visit. Patient educated on when it is appropriate to go to the emergency department.   Mary-Margaret Gaylyn Keas, FNP

## 2023-11-16 NOTE — Patient Instructions (Signed)
 Gout  Gout is painful swelling of your joints. Gout is a type of arthritis. It is caused by having too much uric acid in your body. Uric acid is a chemical that is made when your body breaks down substances called purines. If your body has too much uric acid, sharp crystals can form and build up in your joints. This causes pain and swelling. Gout attacks can happen quickly and be very painful (acute gout). Over time, the attacks can affect more joints and happen more often (chronic gout). What are the causes? Gout is caused by too much uric acid in your blood. This can happen because: Your kidneys do not remove enough uric acid from your blood. Your body makes too much uric acid. You eat too many foods that are high in purines. These foods include organ meats, some seafood, and beer. Trauma or stress can bring on an attack. What increases the risk? Having a family history of gout. Being female and middle-aged. Being female and having gone through menopause. Having an organ transplant. Taking certain medicines. Having certain conditions, such as: Being very overweight (obese). Lead poisoning. Kidney disease. A skin condition called psoriasis. Other risks include: Losing weight too quickly. Not having enough water in the body (being dehydrated). Drinking alcohol, especially beer. Drinking beverages that are sweetened with a type of sugar called fructose. What are the signs or symptoms? An attack of acute gout often starts at night and usually happens in just one joint. The most common place is the big toe. Other joints that may be affected include joints of the feet, ankle, knee, fingers, wrist, or elbow. Symptoms may include: Very bad pain. Warmth. Swelling. Stiffness. Tenderness. The affected joint may be very painful to touch. Shiny, red, or purple skin. Chills and fever. Chronic gout may cause symptoms more often. More joints may be involved. You may also have white or yellow lumps  (tophi) on your hands or feet or in other areas near your joints. How is this treated? Treatment for an acute attack may include medicines for pain and swelling, such as: NSAIDs, such as ibuprofen. Steroids taken by mouth or injected into a joint. Colchicine. This can be given by mouth or through an IV tube. Treatment to prevent future attacks may include: Taking small doses of NSAIDs or colchicine daily. Using a medicine that reduces uric acid levels in your blood, such as allopurinol. Making changes to your diet. You may need to see a food expert (dietitian) about what to eat and drink to prevent gout. Follow these instructions at home: During a gout attack  If told, put ice on the painful area. To do this: Put ice in a plastic bag. Place a towel between your skin and the bag. Leave the ice on for 20 minutes, 2-3 times a day. Take off the ice if your skin turns bright red. This is very important. If you cannot feel pain, heat, or cold, you have a greater risk of damage to the area. Raise the painful joint above the level of your heart as often as you can. Rest the joint as much as possible. If the joint is in your leg, you may be given crutches. Follow instructions from your doctor about what you cannot eat or drink. Avoiding future gout attacks Eat a low-purine diet. Avoid foods and drinks such as: Liver. Kidney. Anchovies. Asparagus. Herring. Mushrooms. Mussels. Beer. Stay at a healthy weight. If you want to lose weight, talk with your doctor. Do not  lose weight too fast. Start or continue an exercise plan as told by your doctor. Eating and drinking Avoid drinks sweetened by fructose. Drink enough fluids to keep your pee (urine) pale yellow. If you drink alcohol: Limit how much you have to: 0-1 drink a day for women who are not pregnant. 0-2 drinks a day for men. Know how much alcohol is in a drink. In the [place] one drink equals one 12 oz bottle of beer (355 mL), one 5 oz  glass of wine (148 mL), or one 1 oz glass of hard liquor (44 mL). General instructions Take over-the-counter and prescription medicines only as told by your doctor. Ask your doctor if you should avoid driving or using machines while you are taking your medicine. Return to your normal activities when your doctor says that it is safe. Keep all follow-up visits. Where to find more information [employer]: www.niams.http://www.myers.net/ Contact a doctor if: You have another gout attack. You still have symptoms of a gout attack after 10 days of treatment. You have problems (side effects) because of your medicines. You have chills or a fever. You have burning pain when you pee (urinate). You have pain in your lower back or belly. Get help right away if: You have very bad pain. Your pain cannot be controlled. You cannot pee. Summary Gout is painful swelling of the joints. The most common site of pain is the big toe, but it can affect other joints. Medicines and avoiding some foods can help to prevent and treat gout attacks. This information is not intended to replace advice given to you by your health care provider. Make sure you discuss any questions you have with your health care provider. Document Revised: 03/05/2021 Document Reviewed: 03/05/2021 Elsevier Patient Education  2024 [employer].

## 2023-11-17 LAB — ARTHRITIS PANEL
Basophils Absolute: 0.1 10*3/uL (ref 0.0–0.2)
Basos: 1 %
EOS (ABSOLUTE): 0.5 10*3/uL — ABNORMAL HIGH (ref 0.0–0.4)
Eos: 6 %
Hematocrit: 44.7 % (ref 34.0–46.6)
Hemoglobin: 15.1 g/dL (ref 11.1–15.9)
Immature Grans (Abs): 0 10*3/uL (ref 0.0–0.1)
Immature Granulocytes: 0 %
Lymphocytes Absolute: 2.6 10*3/uL (ref 0.7–3.1)
Lymphs: 30 %
MCH: 32.6 pg (ref 26.6–33.0)
MCHC: 33.8 g/dL (ref 31.5–35.7)
MCV: 97 fL (ref 79–97)
Monocytes Absolute: 0.7 10*3/uL (ref 0.1–0.9)
Monocytes: 8 %
Neutrophils Absolute: 4.8 10*3/uL (ref 1.4–7.0)
Neutrophils: 55 %
Platelets: 338 10*3/uL (ref 150–450)
RBC: 4.63 x10E6/uL (ref 3.77–5.28)
RDW: 12.4 % (ref 11.7–15.4)
Sed Rate: 9 mm/h (ref 0–40)
Uric Acid: 8.7 mg/dL — ABNORMAL HIGH (ref 3.0–7.2)
WBC: 8.6 10*3/uL (ref 3.4–10.8)

## 2023-11-19 ENCOUNTER — Ambulatory Visit: Payer: Self-pay | Admitting: Nurse Practitioner

## 2023-12-02 ENCOUNTER — Other Ambulatory Visit: Payer: Self-pay | Admitting: Nurse Practitioner

## 2023-12-06 ENCOUNTER — Telehealth: Payer: Self-pay

## 2023-12-06 ENCOUNTER — Other Ambulatory Visit: Payer: Self-pay | Admitting: Nurse Practitioner

## 2023-12-06 DIAGNOSIS — Z1231 Encounter for screening mammogram for malignant neoplasm of breast: Secondary | ICD-10-CM

## 2023-12-06 NOTE — Telephone Encounter (Signed)
 Copied from CRM [id number]. Topic: Clinical - Request for Lab/Test Order >> Dec 06, 2023 11:19 AM Elle L wrote: Reason for CRM: The patient has an order for a mammogram but the [facility] is out of network with her insurance and would cost $500 out of pocket. She is requesting to see if the order can be sent somewhere in network with her insurance. Her call back number is [phone].

## 2023-12-07 NOTE — Telephone Encounter (Signed)
 Release: [id number] - Sent to [facility]  Breast Scheduling Department at this time.

## 2023-12-24 ENCOUNTER — Encounter (HOSPITAL_COMMUNITY): Payer: Self-pay

## 2023-12-24 ENCOUNTER — Ambulatory Visit (HOSPITAL_COMMUNITY)
Admission: RE | Admit: 2023-12-24 | Discharge: 2023-12-24 | Disposition: A | Discharge status: Home / Self Care | Source: Ambulatory Visit | Attending: Nurse Practitioner | Admitting: Nurse Practitioner

## 2023-12-24 DIAGNOSIS — Z1231 Encounter for screening mammogram for malignant neoplasm of breast: Secondary | ICD-10-CM | POA: Insufficient documentation

## 2024-01-26 ENCOUNTER — Other Ambulatory Visit: Payer: Self-pay | Admitting: Nurse Practitioner

## 2024-01-26 DIAGNOSIS — E039 Hypothyroidism, unspecified: Secondary | ICD-10-CM

## 2024-01-27 ENCOUNTER — Other Ambulatory Visit: Payer: Self-pay | Admitting: *Deleted

## 2024-02-28 ENCOUNTER — Ambulatory Visit: Admitting: Nurse Practitioner

## 2024-02-28 ENCOUNTER — Encounter: Payer: Self-pay | Admitting: Nurse Practitioner

## 2024-02-28 VITALS — BP 118/72 | HR 78 | Temp 97.2°F | Ht 67.0 in | Wt 170.0 lb

## 2024-02-28 DIAGNOSIS — Z0001 Encounter for general adult medical examination with abnormal findings: Secondary | ICD-10-CM

## 2024-02-28 DIAGNOSIS — G8929 Other chronic pain: Secondary | ICD-10-CM

## 2024-02-28 DIAGNOSIS — E039 Hypothyroidism, unspecified: Secondary | ICD-10-CM

## 2024-02-28 DIAGNOSIS — M810 Age-related osteoporosis without current pathological fracture: Secondary | ICD-10-CM

## 2024-02-28 DIAGNOSIS — F419 Anxiety disorder, unspecified: Secondary | ICD-10-CM

## 2024-02-28 DIAGNOSIS — Z6827 Body mass index (BMI) 27.0-27.9, adult: Secondary | ICD-10-CM

## 2024-02-28 DIAGNOSIS — M79644 Pain in right finger(s): Secondary | ICD-10-CM

## 2024-02-28 DIAGNOSIS — I10 Essential (primary) hypertension: Secondary | ICD-10-CM

## 2024-02-28 DIAGNOSIS — K900 Celiac disease: Secondary | ICD-10-CM

## 2024-02-28 DIAGNOSIS — E785 Hyperlipidemia, unspecified: Secondary | ICD-10-CM | POA: Diagnosis not present

## 2024-02-28 DIAGNOSIS — K219 Gastro-esophageal reflux disease without esophagitis: Secondary | ICD-10-CM

## 2024-02-28 MED ORDER — TRIAMTERENE-HCTZ 37.5-25 MG PO TABS: 1.0000 | ORAL_TABLET | Freq: Every day | ORAL | 1 refills | Status: AC

## 2024-02-28 MED ORDER — LEVOTHYROXINE SODIUM 75 MCG PO TABS: 75.0000 ug | ORAL_TABLET | Freq: Every day | ORAL | 1 refills | Status: AC

## 2024-02-28 MED ORDER — CELECOXIB 200 MG PO CAPS: 200.0000 mg | ORAL_CAPSULE | Freq: Two times a day (BID) | ORAL | 2 refills | Status: DC

## 2024-02-28 MED ORDER — LORAZEPAM 0.5 MG PO TABS: 0.5000 mg | ORAL_TABLET | Freq: Two times a day (BID) | ORAL | 1 refills | Status: AC | PRN

## 2024-02-28 MED ORDER — EZETIMIBE 10 MG PO TABS: 10.0000 mg | ORAL_TABLET | Freq: Every day | ORAL | 1 refills | Status: AC

## 2024-02-28 NOTE — Progress Notes (Signed)
 Subjective:    Patient ID: Maria [name], female    DOB: 1960/07/08, 63 y.o.   MRN: [id number]   Chief Complaint: No chief complaint on file.    HPI:  Maria [name] is a 63 y.o. who identifies as a female who was assigned female at birth.   Social history: Lives with: husband Work history: retired   Water engineer in today for follow up of the following chronic medical issues:  1. Essential hypertension No c/o chest pain,sob or headache. Does not check blood pressure at home. Is on maxide and wants  to change to something else. Gets cramps often and she thinks is coming from meds. BP Readings from Last 3 Encounters:  11/16/23 98/64  08/27/23 98/65  03/01/23 102/72     2. Hyperlipidemia with target LDL less than 100 Does try to watch diet and stays very active. Lab Results  Component Value Date   CHOL 160 08/27/2023   HDL 46 08/27/2023   LDLCALC 92 08/27/2023   TRIG 124 08/27/2023   CHOLHDL 3.5 08/27/2023   The 10-year ASCVD risk score (Arnett DK, et al., 2019) is: 5.5%   3. CELIAC DISEASE Watches diet to prevent flare ups  4. Gastroesophageal reflux disease without esophagitis Only uses OTC meds as needed  5. Osteoporosis without current pathological fracture, unspecified osteoporosis type Last dexascan as done on 08/28/22. T score was -2.5. patient is currently on fosamax  daily  6. BMI 27.0-27.9,adult Weight is down 3 lbs  Wt Readings from Last 3 Encounters:  02/28/24 170 lb (77.1 kg)  11/16/23 173 lb (78.5 kg)  08/27/23 175 lb (79.4 kg)   BMI Readings from Last 3 Encounters:  02/28/24 26.63 kg/m  11/16/23 27.10 kg/m  08/27/23 27.41 kg/m       New complaints: Thumb pain-pops and hurts when she bends it. Has been wearing thumb spica splint some that helps. Took motrin last night and that helped.  Allergies  Allergen Reactions   Claritin [Loratadine] Nausea And Vomiting   Livalo  [Pitavastatin ]     myopathy   Outpatient Encounter Medications as of  02/28/2024  Medication Sig   alendronate  (FOSAMAX ) 70 MG tablet TAKE ONE TABLET BY MOUTH EVERY 7 DAYS. TAKE WITH FULL GLASS OF WATER ON AN EMPTY STOMACH.   colchicine  0.6 MG tablet Take 2 tabs immediately, then 1 tab twice per day for the duration of the flare up to a max of 7 days (Patient not taking: Reported on 08/27/2023)   Cyanocobalamin  (VITAMIN B 12 PO) Take by mouth.   ezetimibe  (ZETIA ) 10 MG tablet Take 1 tablet (10 mg total) by mouth daily.   levothyroxine  (SYNTHROID ) 75 MCG tablet Take 1 tablet by mouth once daily   LORazepam  (ATIVAN ) 0.5 MG tablet Take 1 tablet (0.5 mg total) by mouth 2 (two) times daily as needed for anxiety.   triamterene -hydrochlorothiazide (MAXZIDE-25) 37.5-25 MG tablet Take 1 tablet by mouth daily.   No facility-administered encounter medications on file as of 02/28/2024.    Past Surgical History:  Procedure Laterality Date   ELBOW FRACTURE SURGERY Left    INNER EAR SURGERY Left    x 2   TUBAL LIGATION      Family History  Problem Relation Age of Onset   COPD Mother    Heart disease Mother        MI   Diabetes Mother    Colon cancer Father 48   Diabetes Father    Heart disease Father    Osteoporosis Sister  Other Sister        Fatty liver   Diabetes Brother    Breast cancer Maternal Grandmother    Glaucoma Paternal Grandmother    Breast cancer Paternal Aunt    Esophageal cancer Neg Hx    Rectal cancer Neg Hx    Stomach cancer Neg Hx       Controlled substance contract: n/a     Review of Systems  Constitutional:  Negative for diaphoresis.  Eyes:  Negative for pain.  Respiratory:  Negative for shortness of breath.   Cardiovascular:  Negative for chest pain, palpitations and leg swelling.  Gastrointestinal:  Negative for abdominal pain.  Endocrine: Negative for polydipsia.  Skin:  Negative for rash.  Neurological:  Negative for dizziness, weakness and headaches.  Hematological:  Does not bruise/bleed easily.  All other systems  reviewed and are negative.      Objective:   Physical Exam Vitals and nursing note reviewed.  Constitutional:      General: She is not in acute distress.    Appearance: Normal appearance. She is well-developed.  HENT:     Head: Normocephalic.     Right Ear: Tympanic membrane normal.     Left Ear: Tympanic membrane normal.     Nose: Nose normal.     Mouth/Throat:     Mouth: Mucous membranes are moist.  Eyes:     Pupils: Pupils are equal, round, and reactive to light.  Neck:     Vascular: No carotid bruit or JVD.  Cardiovascular:     Rate and Rhythm: Normal rate and regular rhythm.     Heart sounds: Normal heart sounds.  Pulmonary:     Effort: Pulmonary effort is normal. No respiratory distress.     Breath sounds: Normal breath sounds. No wheezing or rales.  Chest:     Chest wall: No tenderness.  Abdominal:     General: Bowel sounds are normal. There is no distension or abdominal bruit.     Palpations: Abdomen is soft. There is no hepatomegaly, splenomegaly, mass or pulsatile mass.     Tenderness: There is no abdominal tenderness.  Musculoskeletal:        General: Normal range of motion.     Cervical back: Normal range of motion and neck supple.  Lymphadenopathy:     Cervical: No cervical adenopathy.  Skin:    General: Skin is warm and dry.  Neurological:     Mental Status: She is alert and oriented to person, place, and time.     Deep Tendon Reflexes: Reflexes are normal and symmetric.  Psychiatric:        Behavior: Behavior normal.        Thought Content: Thought content normal.        Judgment: Judgment normal.    There were no vitals taken for this visit.        Assessment & Plan:   Maria [name] comes in today with chief complaint of annual physical  Diagnosis and orders addressed:  1. Essential hypertension Low sodium diet - triamterene -hydrochlorothiazide (MAXZIDE-25) 37.5-25 MG tablet; Take 1 tablet by mouth daily.  Dispense: 90 tablet; Refill: 1 -  CBC with Differential/Platelet - CMP14+EGFR  2. Hyperlipidemia with target LDL less than 100 Low fat diet - ezetimibe  (ZETIA ) 10 MG tablet; Take 1 tablet (10 mg total) by mouth daily.  Dispense: 90 tablet; Refill: 1 - Lipid panel  3. CELIAC DISEASE Continue to watch diet  4. Gastroesophageal reflux disease without esophagitis Avoid  spicy foods Do not eat 2 hours prior to bedtime   5. Osteoporosis without current pathological fracture, unspecified osteoporosis type Weight bearing exercises  6. BMI 27.0-27.9,adult Discussed diet and exercise for person with BMI >25 Will recheck weight in 3-6 months   7. Acquired hypothyroidism Labs pending - levothyroxine  (SYNTHROID ) 75 MCG tablet; Take 1 tablet (75 mcg total) by mouth daily.  Dispense: 90 tablet; Refill: 1 - Thyroid  Panel With TSH  8. Anxiety Stress management - LORazepam  (ATIVAN ) 0.5 MG tablet; Take 1 tablet (0.5 mg total) by mouth 2 (two) times daily as needed for anxiety.  Dispense: 30 tablet; Refill: 1  9. Thumb pain Warm epsom salt soaks Celebrex  200mg  1 po bid daily If does not improve will do ortho referral  Labs pending Health Maintenance reviewed Diet and exercise encouraged  Follow up plan: 6 months   Maria Gladis, FNP

## 2024-02-28 NOTE — Patient Instructions (Signed)

## 2024-02-29 ENCOUNTER — Ambulatory Visit: Payer: Self-pay | Admitting: Nurse Practitioner

## 2024-02-29 LAB — CBC WITH DIFFERENTIAL/PLATELET
Basophils Absolute: 0.1 x10E3/uL (ref 0.0–0.2)
Basos: 1 %
EOS (ABSOLUTE): 0.5 x10E3/uL — ABNORMAL HIGH (ref 0.0–0.4)
Eos: 5 %
Hematocrit: 45.6 % (ref 34.0–46.6)
Hemoglobin: 15.6 g/dL (ref 11.1–15.9)
Immature Grans (Abs): 0 x10E3/uL (ref 0.0–0.1)
Immature Granulocytes: 0 %
Lymphocytes Absolute: 2.1 x10E3/uL (ref 0.7–3.1)
Lymphs: 24 %
MCH: 32.6 pg (ref 26.6–33.0)
MCHC: 34.2 g/dL (ref 31.5–35.7)
MCV: 95 fL (ref 79–97)
Monocytes Absolute: 0.6 x10E3/uL (ref 0.1–0.9)
Monocytes: 7 %
Neutrophils Absolute: 5.4 x10E3/uL (ref 1.4–7.0)
Neutrophils: 63 %
Platelets: 316 x10E3/uL (ref 150–450)
RBC: 4.78 x10E6/uL (ref 3.77–5.28)
RDW: 12.2 % (ref 11.7–15.4)
WBC: 8.7 x10E3/uL (ref 3.4–10.8)

## 2024-02-29 LAB — LIPID PANEL
Chol/HDL Ratio: 3.7 ratio (ref 0.0–4.4)
Cholesterol, Total: 161 mg/dL (ref 100–199)
HDL: 43 mg/dL (ref >39)
LDL Chol Calc (NIH): 94 mg/dL (ref 0–99)
Triglycerides: 133 mg/dL (ref 0–149)
VLDL Cholesterol Cal: 24 mg/dL (ref 5–40)

## 2024-02-29 LAB — CMP14+EGFR
ALT: 26 IU/L (ref 0–32)
AST: 26 IU/L (ref 0–40)
Albumin: 4 g/dL (ref 3.9–4.9)
Alkaline Phosphatase: 75 IU/L (ref 49–135)
BUN/Creatinine Ratio: 18 (ref 12–28)
BUN: 14 mg/dL (ref 8–27)
Bilirubin Total: 0.3 mg/dL (ref 0.0–1.2)
CO2: 24 mmol/L (ref 20–29)
Calcium: 8.8 mg/dL (ref 8.7–10.3)
Chloride: 104 mmol/L (ref 96–106)
Creatinine, Ser: 0.79 mg/dL (ref 0.57–1.00)
Globulin, Total: 2.2 g/dL (ref 1.5–4.5)
Glucose: 132 mg/dL — ABNORMAL HIGH (ref 70–99)
Potassium: 3.9 mmol/L (ref 3.5–5.2)
Sodium: 143 mmol/L (ref 134–144)
Total Protein: 6.2 g/dL (ref 6.0–8.5)
eGFR: 83 mL/min/1.73 (ref >59)

## 2024-02-29 LAB — THYROID PANEL WITH TSH
Free Thyroxine Index: 2.3 (ref 1.2–4.9)
T3 Uptake Ratio: 24 % (ref 24–39)
T4, Total: 9.6 ug/dL (ref 4.5–12.0)
TSH: 1.96 u[IU]/mL (ref 0.450–4.500)

## 2024-02-29 LAB — VITAMIN D 25 HYDROXY (VIT D DEFICIENCY, FRACTURES): Vit D, 25-Hydroxy: 53.7 ng/mL (ref 30.0–100.0)

## 2024-03-06 ENCOUNTER — Other Ambulatory Visit: Payer: Self-pay | Admitting: *Deleted

## 2024-03-06 MED ORDER — ALENDRONATE SODIUM 70 MG PO TABS: 70.0000 mg | ORAL_TABLET | ORAL | 1 refills | Status: AC

## 2024-05-01 ENCOUNTER — Ambulatory Visit: Admitting: Nurse Practitioner

## 2024-05-01 ENCOUNTER — Ambulatory Visit: Payer: Self-pay

## 2024-05-01 VITALS — BP 120/79 | HR 87 | Temp 97.5°F | Ht 67.0 in | Wt 174.0 lb

## 2024-05-01 DIAGNOSIS — M542 Cervicalgia: Secondary | ICD-10-CM

## 2024-05-01 MED ORDER — KETOROLAC TROMETHAMINE 60 MG/2ML IM SOLN
60.0000 mg | Freq: Once | INTRAMUSCULAR | Status: AC
  Administered 2024-05-01: 60 mg via INTRAMUSCULAR

## 2024-05-01 MED ORDER — PREDNISONE 20 MG PO TABS: ORAL_TABLET | ORAL | 0 refills | Status: AC

## 2024-05-01 MED ORDER — METHYLPREDNISOLONE ACETATE 80 MG/ML IJ SUSP
80.0000 mg | Freq: Once | INTRAMUSCULAR | Status: AC
  Administered 2024-05-01: 80 mg via INTRAMUSCULAR

## 2024-05-01 NOTE — Patient Instructions (Signed)
Cervical Radiculopathy  Cervical radiculopathy happens when a nerve in the neck (a cervical nerve) is pinched or bruised. This condition can happen because of an injury to the cervical spine (vertebrae) in the neck, or as part of the normal aging process. Pressure on the cervical nerves can cause pain or numbness that travels from the neck all the way down to the arm and fingers. This condition usually gets better with rest. Treatment may be needed if the condition does not improve. What are the causes? This condition may be caused by: A neck injury. A bulging (herniated) disk. Muscle spasms. Muscle tightness in the neck due to overuse. Arthritis. Breakdown or degeneration in the bones and joints of the spine (spondylosis) due to aging. Bone spurs that may develop near the cervical nerves. What are the signs or symptoms? Symptoms of this condition include: Pain. The pain may travel from the neck to the arm and hand. The pain can be severe or irritating. It may get worse when you move your neck. Numbness or tingling in your arm or hand. Weakness in the affected arm and hand, in severe cases. How is this diagnosed? This condition may be diagnosed based on your symptoms, your medical history, and a physical exam. You may also have tests, including: X-rays. CT scan. MRI. Electromyogram (EMG). Nerve conduction tests. How is this treated? In many cases, treatment is not needed for this condition. With rest, the condition usually gets better over time. If treatment is needed, options may include: Wearing a soft neck collar (cervical collar) for short periods of time. Doing physical therapy to strengthen your neck muscles. Taking medicines. These may include NSAIDs, such as ibuprofen, or oral corticosteroids. Having spinal injections, in severe cases. Having surgery. This may be needed if other treatments do not help. Different types of surgery may be done depending on the cause of this  condition. Follow these instructions at home: If you have a cervical collar: Wear it as told by your health care provider. Remove it only as told by your health care provider. Ask your health care provider if you can remove the cervical collar for cleaning and bathing. If you are allowed to remove the collar for cleaning or bathing: Follow instructions from your health care provider about how to remove the collar safely. Clean the collar by wiping it with mild soap and water and drying it completely. Take out any removable pads in the collar every 1-2 days, and wash them by hand with soap and water. Let them air-dry completely before you put them back in the collar. Check your skin under the collar for irritation or sores. If you see any, tell your health care provider. Managing pain     Take over-the-counter and prescription medicines only as told by your health care provider. If directed, put ice on the affected area. To do this: If you have a soft neck collar, remove it as told by your health care provider. Put ice in a plastic bag. Place a towel between your skin and the bag. Leave the ice on for 20 minutes, 2-3 times a day. Remove the ice if your skin turns bright red. This is very important. If you cannot feel pain, heat, or cold, you have a greater risk of damage to the area. If applying ice does not help, you can try using heat. Use the heat source that your health care provider recommends, such as a moist heat pack or a heating pad. Place a towel between   your skin and the heat source. Leave the heat on for 20-30 minutes. Remove the heat if your skin turns bright red. This is especially important if you are unable to feel pain, heat, or cold. You have a greater risk of getting burned. Try a gentle neck and shoulder massage to help relieve symptoms. Activity Rest as needed. Return to your normal activities as told by your health care provider. Ask your health care provider what  activities are safe for you. Do stretching and strengthening exercises as told by your health care provider or your physical therapist. You may have to avoid lifting. Ask your health care provider how much you can safely lift. General instructions Use a flat pillow when you sleep. Do not drive while wearing a cervical collar. If you do not have a cervical collar, ask your health care provider if it is safe to drive while your neck heals. Ask your health care provider if the medicine prescribed to you requires you to avoid driving or using machinery. Do not use any products that contain nicotine or tobacco. These products include cigarettes, chewing tobacco, and vaping devices, such as e-cigarettes. If you need help quitting, ask your health care provider. Keep all follow-up visits. This is important. Contact a health care provider if: Your condition does not improve with treatment. Get help right away if: Your pain gets much worse and is not controlled with medicines. You have weakness or numbness in your hand, arm, face, or leg. You have a high fever. You have a stiff, rigid neck. You lose control of your bowels or your bladder (have incontinence). You have trouble with walking, balance, or speaking. Summary Cervical radiculopathy happens when a nerve in the neck is pinched or bruised. A nerve can get pinched from a bulging disk, arthritis, muscle spasms, or an injury to the neck. Symptoms include pain, tingling, or numbness radiating from the neck to the arm or hand. Weakness can also occur in severe cases. Treatment may include rest, wearing a cervical collar, and physical therapy. Medicines may be prescribed to help with pain. In severe cases, injections or surgery may be needed. This information is not intended to replace advice given to you by your health care provider. Make sure you discuss any questions you have with your health care provider. Document Revised: 12/05/2020 Document  Reviewed: 12/05/2020 Elsevier Patient Education  2024 [employer]  

## 2024-05-01 NOTE — Telephone Encounter (Signed)
 FYI Only or Action Required?: FYI only for provider: appointment scheduled on today.  Patient was last seen in primary care on 02/28/2024 by Gladis Mustard, FNP.  Called Nurse Triage reporting Neck Pain.  Symptoms began several days ago.  Interventions attempted: OTC medications: Ibuprofen, Prescription medications: Flexeril , and Ice/heat application.  Symptoms are: gradually worsening.  Triage Disposition: See PCP When Office is Open (Within 3 Days)  Patient/caregiver understands and will follow disposition?: Yes    Copied from CRM #[id number]. Topic: Clinical - Red Word Triage >> May 01, 2024  9:40 AM Zane F wrote: [facility] that prompted transfer to Nurse Triage:   Concern: Neck Pain  Symptoms:  Pain Level 1-10: 5-10 all weekend  When did the symptoms start?: Friday Morning   What have you done to aid in the concern ? Have you taken anything to assist with the matter?: Yes  If so, what did you take?: Ibuprofen (3-200 mg gave some relief but not much) ; flexeril  from her husband     Reason for Disposition  [1] MODERATE neck pain (e.g., interferes with normal activities) AND [2] present > 3 days  Answer Assessment - Initial Assessment Questions 1. ONSET: When did the pain begin?      3 days ago  2. LOCATION: Where does it hurt?      Back and right sided neck  3. PATTERN Does the pain come and go, or has it been constant since it started?      Constant  4. SEVERITY: How bad is the pain?  (Scale 0-10; or none or slight stiffness, mild, moderate, severe)     Moderate to severe  5. RADIATION: Does the pain go anywhere else, shoot into your arms?     Some pain to right arm over night 6. CORD SYMPTOMS: Any weakness or numbness of the arms or legs?     No 7. CAUSE: What do you think is causing the neck pain?     Unsure  8. NECK OVERUSE: Any recent activities that involved turning or twisting the neck?     No 9. OTHER SYMPTOMS: Do you have any  other symptoms? (e.g., headache, fever, chest pain, difficulty breathing, neck swelling)     No  Protocols used: Neck Pain or Stiffness-A-AH

## 2024-05-01 NOTE — Progress Notes (Signed)
   Subjective:    Patient ID: Maria [name], female    DOB: 11/09/1960, 63 y.o.   MRN: [id number]   Chief Complaint: Neck Pain (Since Friday. No injury)   Neck Pain  This is a new problem. The current episode started in the past 7 days. The problem occurs intermittently. The problem has been gradually worsening. The pain is associated with nothing. The pain is present in the right side. The quality of the pain is described as shooting. The pain is at a severity of 4/10. The pain is moderate. The symptoms are aggravated by bending. The pain is Same all the time. Pertinent negatives include no headaches, pain with swallowing or weakness. She has tried NSAIDs and heat for the symptoms. The treatment provided moderate relief.     Patient Active Problem List   Diagnosis Date Noted   Anxiety 02/28/2024   Acquired hypothyroidism 02/28/2024   Osteoporosis 09/20/2015   BMI 27.0-27.9,adult 09/10/2015   Essential hypertension 07/19/2014   Hyperlipidemia with target LDL less than 100 07/19/2014   External hemorrhoids 07/23/2008   GERD 07/23/2008   CELIAC DISEASE 07/23/2008   History of colonic polyps 07/23/2008       Review of Systems  Musculoskeletal:  Positive for neck pain.  Neurological:  Negative for weakness and headaches.       Objective:   Physical Exam Constitutional:      Appearance: Normal appearance.  Cardiovascular:     Rate and Rhythm: Normal rate and regular rhythm.     Heart sounds: Normal heart sounds.  Pulmonary:     Breath sounds: Normal breath sounds.  Musculoskeletal:     Comments: FROM of lumbar spine with pain on rotation to right, and tilting to right  Skin:    General: Skin is warm.  Neurological:     General: No focal deficit present.     Mental Status: She is alert and oriented to person, place, and time.  Psychiatric:        Mood and Affect: Mood normal.        Behavior: Behavior normal.     BP 120/79   Pulse 87   Temp (!) 97.5 F (36.4 C)  (Temporal)   Ht 5' 7 (1.702 m)   Wt 174 lb (78.9 kg)   SpO2 97%   BMI 27.25 kg/m        Assessment & Plan:  Maria [name] in today with chief complaint of Neck Pain (Since Friday. No injury)   1. Neck pain without injury (Primary) Ice or heat Stretches RTO prn - ketorolac (TORADOL) injection 60 mg - methylPREDNISolone  acetate (DEPO-MEDROL ) injection 80 mg - predniSONE  (DELTASONE ) 20 MG tablet; 2 po at sametime daily for 5 days-  Dispense: 10 tablet; Refill: 0    The above assessment and management plan was discussed with the patient. The patient verbalized understanding of and has agreed to the management plan. Patient is aware to call the clinic if symptoms persist or worsen. Patient is aware when to return to the clinic for a follow-up visit. Patient educated on when it is appropriate to go to the emergency department.   Mary-Margaret Gladis, FNP

## 2024-05-08 ENCOUNTER — Other Ambulatory Visit: Payer: Self-pay | Admitting: Nurse Practitioner

## 2024-05-08 DIAGNOSIS — M542 Cervicalgia: Secondary | ICD-10-CM

## 2024-05-19 ENCOUNTER — Telehealth: Payer: Self-pay

## 2024-05-19 NOTE — Telephone Encounter (Signed)
 Called and spoke with patient and advised that insurance is denying MRI until PT for 6 weeks is complete. Patient verbalized understanding and wanted to wait on that. She states that she used a couple of her husbands flexeril  and they have helped a lot. Can she get a rx for herself? Please advise

## 2024-05-21 ENCOUNTER — Other Ambulatory Visit: Payer: Self-pay | Admitting: Nurse Practitioner

## 2024-05-21 DIAGNOSIS — G8929 Other chronic pain: Secondary | ICD-10-CM

## 2024-05-22 MED ORDER — CYCLOBENZAPRINE HCL 10 MG PO TABS: 10.0000 mg | ORAL_TABLET | Freq: Three times a day (TID) | ORAL | 0 refills | Status: AC | PRN

## 2024-07-18 ENCOUNTER — Other Ambulatory Visit: Payer: Self-pay | Admitting: Nurse Practitioner

## 2024-07-18 DIAGNOSIS — G8929 Other chronic pain: Secondary | ICD-10-CM

## 2024-08-24 ENCOUNTER — Ambulatory Visit: Payer: Self-pay | Admitting: Nurse Practitioner
